# Patient Record
Sex: Male | Born: 1950
Health system: Southern US, Community
[De-identification: ages and names within clinical notes are randomized; demographics above are authoritative.]

## PROBLEM LIST (undated history)

## (undated) DIAGNOSIS — M1990 Unspecified osteoarthritis, unspecified site: Secondary | ICD-10-CM

## (undated) DIAGNOSIS — E7800 Pure hypercholesterolemia, unspecified: Secondary | ICD-10-CM

## (undated) DIAGNOSIS — K429 Umbilical hernia without obstruction or gangrene: Secondary | ICD-10-CM

## (undated) DIAGNOSIS — R7303 Prediabetes: Secondary | ICD-10-CM

## (undated) DIAGNOSIS — K219 Gastro-esophageal reflux disease without esophagitis: Secondary | ICD-10-CM

## (undated) DIAGNOSIS — H8109 Meniere's disease, unspecified ear: Secondary | ICD-10-CM

## (undated) HISTORY — PX: KNEE SURGERY: SHX244

## (undated) HISTORY — PX: KNEE ARTHROSCOPY: SUR90

## (undated) HISTORY — DX: Prediabetes: R73.03

## (undated) HISTORY — PX: CATARACT EXTRACTION: SUR2

## (undated) HISTORY — DX: Gastro-esophageal reflux disease without esophagitis: K21.9

## (undated) HISTORY — DX: Unspecified osteoarthritis, unspecified site: M19.90

---

## 2006-09-12 ENCOUNTER — Encounter: Admission: RE | Admit: 2006-09-12 | Discharge: 2006-09-12 | Payer: Self-pay | Admitting: Family Medicine

## 2009-05-06 ENCOUNTER — Emergency Department (HOSPITAL_COMMUNITY): Admission: EM | Admit: 2009-05-06 | Discharge: 2009-05-06 | Payer: Self-pay | Admitting: Emergency Medicine

## 2011-05-20 ENCOUNTER — Other Ambulatory Visit: Payer: Self-pay | Admitting: Otolaryngology

## 2011-05-20 DIAGNOSIS — H9191 Unspecified hearing loss, right ear: Secondary | ICD-10-CM

## 2011-05-24 ENCOUNTER — Ambulatory Visit
Admission: RE | Admit: 2011-05-24 | Discharge: 2011-05-24 | Disposition: A | Discharge status: Home / Self Care | Payer: 59 | Source: Ambulatory Visit | Attending: Otolaryngology | Admitting: Otolaryngology

## 2011-05-24 DIAGNOSIS — H9191 Unspecified hearing loss, right ear: Secondary | ICD-10-CM

## 2011-05-24 MED ORDER — GADOBENATE DIMEGLUMINE 529 MG/ML IV SOLN
19.0000 mL | Freq: Once | INTRAVENOUS | Status: AC | PRN
  Administered 2011-05-24: 19 mL via INTRAVENOUS

## 2018-01-16 DIAGNOSIS — H8101 Meniere's disease, right ear: Secondary | ICD-10-CM | POA: Diagnosis not present

## 2018-01-16 DIAGNOSIS — H903 Sensorineural hearing loss, bilateral: Secondary | ICD-10-CM | POA: Diagnosis not present

## 2018-05-22 DIAGNOSIS — H524 Presbyopia: Secondary | ICD-10-CM | POA: Diagnosis not present

## 2018-05-22 DIAGNOSIS — H5200 Hypermetropia, unspecified eye: Secondary | ICD-10-CM | POA: Diagnosis not present

## 2018-05-22 DIAGNOSIS — H2513 Age-related nuclear cataract, bilateral: Secondary | ICD-10-CM | POA: Diagnosis not present

## 2018-05-22 DIAGNOSIS — H52229 Regular astigmatism, unspecified eye: Secondary | ICD-10-CM | POA: Diagnosis not present

## 2018-05-22 DIAGNOSIS — H35352 Cystoid macular degeneration, left eye: Secondary | ICD-10-CM | POA: Diagnosis not present

## 2018-05-22 DIAGNOSIS — H5231 Anisometropia: Secondary | ICD-10-CM | POA: Diagnosis not present

## 2018-05-22 DIAGNOSIS — H5210 Myopia, unspecified eye: Secondary | ICD-10-CM | POA: Diagnosis not present

## 2018-05-24 DIAGNOSIS — Z125 Encounter for screening for malignant neoplasm of prostate: Secondary | ICD-10-CM | POA: Diagnosis not present

## 2018-05-24 DIAGNOSIS — K219 Gastro-esophageal reflux disease without esophagitis: Secondary | ICD-10-CM | POA: Diagnosis not present

## 2018-05-24 DIAGNOSIS — Z0000 Encounter for general adult medical examination without abnormal findings: Secondary | ICD-10-CM | POA: Diagnosis not present

## 2018-05-24 DIAGNOSIS — E7800 Pure hypercholesterolemia, unspecified: Secondary | ICD-10-CM | POA: Diagnosis not present

## 2018-05-24 DIAGNOSIS — R030 Elevated blood-pressure reading, without diagnosis of hypertension: Secondary | ICD-10-CM | POA: Diagnosis not present

## 2018-05-24 DIAGNOSIS — H8109 Meniere's disease, unspecified ear: Secondary | ICD-10-CM | POA: Diagnosis not present

## 2018-05-24 DIAGNOSIS — J309 Allergic rhinitis, unspecified: Secondary | ICD-10-CM | POA: Diagnosis not present

## 2018-05-24 DIAGNOSIS — Z23 Encounter for immunization: Secondary | ICD-10-CM | POA: Diagnosis not present

## 2018-05-24 DIAGNOSIS — R7303 Prediabetes: Secondary | ICD-10-CM | POA: Diagnosis not present

## 2018-10-29 DIAGNOSIS — H8101 Meniere's disease, right ear: Secondary | ICD-10-CM | POA: Diagnosis not present

## 2018-10-29 DIAGNOSIS — H903 Sensorineural hearing loss, bilateral: Secondary | ICD-10-CM | POA: Diagnosis not present

## 2020-02-13 ENCOUNTER — Ambulatory Visit: Payer: Self-pay

## 2020-04-06 ENCOUNTER — Other Ambulatory Visit: Payer: Self-pay | Admitting: Surgery

## 2020-05-19 ENCOUNTER — Ambulatory Visit (INDEPENDENT_AMBULATORY_CARE_PROVIDER_SITE_OTHER): Payer: Medicare Other

## 2020-05-19 ENCOUNTER — Other Ambulatory Visit: Payer: Self-pay

## 2020-05-19 ENCOUNTER — Ambulatory Visit (HOSPITAL_COMMUNITY)
Admission: EM | Admit: 2020-05-19 | Discharge: 2020-05-19 | Disposition: A | Discharge status: Home / Self Care | Payer: Medicare Other | Source: Home / Self Care

## 2020-05-19 ENCOUNTER — Encounter (HOSPITAL_COMMUNITY): Payer: Self-pay | Admitting: Orthopedic Surgery

## 2020-05-19 ENCOUNTER — Encounter (HOSPITAL_COMMUNITY): Payer: Self-pay | Admitting: Emergency Medicine

## 2020-05-19 ENCOUNTER — Emergency Department (HOSPITAL_COMMUNITY)
Admission: EM | Admit: 2020-05-19 | Discharge: 2020-05-19 | Disposition: A | Discharge status: Home / Self Care | Payer: Medicare Other | Attending: Emergency Medicine | Admitting: Emergency Medicine

## 2020-05-19 ENCOUNTER — Encounter (HOSPITAL_COMMUNITY): Payer: Self-pay | Admitting: Family Medicine

## 2020-05-19 DIAGNOSIS — W312XXA Contact with powered woodworking and forming machines, initial encounter: Secondary | ICD-10-CM | POA: Insufficient documentation

## 2020-05-19 DIAGNOSIS — Z23 Encounter for immunization: Secondary | ICD-10-CM | POA: Diagnosis not present

## 2020-05-19 DIAGNOSIS — S61214A Laceration without foreign body of right ring finger without damage to nail, initial encounter: Secondary | ICD-10-CM | POA: Diagnosis not present

## 2020-05-19 DIAGNOSIS — Y999 Unspecified external cause status: Secondary | ICD-10-CM | POA: Diagnosis not present

## 2020-05-19 DIAGNOSIS — Y929 Unspecified place or not applicable: Secondary | ICD-10-CM | POA: Insufficient documentation

## 2020-05-19 DIAGNOSIS — S6991XA Unspecified injury of right wrist, hand and finger(s), initial encounter: Secondary | ICD-10-CM

## 2020-05-19 DIAGNOSIS — S61411A Laceration without foreign body of right hand, initial encounter: Secondary | ICD-10-CM

## 2020-05-19 DIAGNOSIS — Y9389 Activity, other specified: Secondary | ICD-10-CM | POA: Diagnosis not present

## 2020-05-19 DIAGNOSIS — S61210A Laceration without foreign body of right index finger without damage to nail, initial encounter: Secondary | ICD-10-CM | POA: Diagnosis not present

## 2020-05-19 DIAGNOSIS — S61011A Laceration without foreign body of right thumb without damage to nail, initial encounter: Secondary | ICD-10-CM | POA: Diagnosis present

## 2020-05-19 MED ORDER — LIDOCAINE HCL (PF) 1 % IJ SOLN
5.0000 mL | Freq: Once | INTRAMUSCULAR | Status: AC
  Administered 2020-05-19: 5 mL via INTRADERMAL
  Filled 2020-05-19: qty 5

## 2020-05-19 MED ORDER — TETANUS-DIPHTH-ACELL PERTUSSIS 5-2.5-18.5 LF-MCG/0.5 IM SUSP
0.5000 mL | Freq: Once | INTRAMUSCULAR | Status: AC
  Administered 2020-05-19: 0.5 mL via INTRAMUSCULAR
  Filled 2020-05-19: qty 0.5

## 2020-05-19 NOTE — ED Triage Notes (Signed)
Pt reports finger injury when working with wood. Reports right thumb is cut deep. Had xray done today that was negative for any fx or foreign bodies.

## 2020-05-19 NOTE — Consult Note (Signed)
Reason for Consult:Right thumb lac Referring Physician: Tillie Moore is an 69 y.o. male.  HPI: Joel Moore was working with a table saw when the piece of wood kicked back on him and struck him in the right hand. He suffered a significant lac and went to UC. They were worried about tendon involvement and sent him to ED where hand surgery was consulted. He is RHD.  History reviewed. No pertinent past medical history.  History reviewed. No pertinent surgical history.  Family History  Problem Relation Age of Onset  . Diabetes Mother     Social History:  reports that he has never smoked. He has never used smokeless tobacco. No history on file for alcohol and drug.  Allergies: Not on File  Medications: I have reviewed the patient's current medications.  No results found for this or any previous visit (from the past 48 hour(s)).  DG Hand Complete Right  Result Date: 05/19/2020 CLINICAL DATA:  Right hand laceration on a table saw today Initial encounter. EXAM: RIGHT HAND - COMPLETE 3+ VIEW COMPARISON:  None. FINDINGS: No acute bony or joint abnormality or radiopaque foreign body is identified. Scattered osteoarthritis is noted. Bandaging is seen about the thumb, index and ring fingers. IMPRESSION: Negative for fracture or radiopaque foreign body. Electronically Signed   By: Joel Moore M.D.   On: 05/19/2020 14:25    Review of Systems  HENT: Negative for ear discharge, ear pain, hearing loss and tinnitus.   Eyes: Negative for photophobia and pain.  Respiratory: Negative for cough and shortness of breath.   Cardiovascular: Negative for chest pain.  Gastrointestinal: Negative for abdominal pain, nausea and vomiting.  Genitourinary: Negative for dysuria, flank pain, frequency and urgency.  Musculoskeletal: Positive for arthralgias (Right thumb). Negative for back pain, myalgias and neck pain.  Neurological: Negative for dizziness and headaches.  Hematological: Does not bruise/bleed  easily.  Psychiatric/Behavioral: The patient is not nervous/anxious.    Blood pressure (!) 153/89, pulse 60, temperature 97.7 F (36.5 C), resp. rate 16, SpO2 99 %. Physical Exam  Constitutional: He appears well-developed and well-nourished. No distress.  HENT:  Head: Normocephalic and atraumatic.  Eyes: Conjunctivae are normal. Right eye exhibits no discharge. Left eye exhibits no discharge. No scleral icterus.  Cardiovascular: Normal rate and regular rhythm.  Respiratory: Effort normal. No respiratory distress.  Musculoskeletal:     Cervical back: Normal range of motion.     Comments: Right shoulder, elbow, wrist, digits- Complex laceration dorsum of thumb over IP joint, tendon exposed but intact, excellent strength with IP flex/ext and MCP flex, ext, add, abd, intact sensation radial/ulnar distally though EDPA reported decreased sensation ulnarly close to base, no instability, no blocks to motion  Sens  Ax/R/M/U intact  Mot   Ax/ R/ PIN/ M/ AIN/ U intact  Rad 2+  Neurological: He is alert.  Skin: Skin is warm and dry. He is not diaphoretic.  Psychiatric: He has a normal mood and affect. His behavior is normal.    Assessment/Plan: Right thumb lac -- Have asked EDPA to I&D and repair skin. He should be splinted with thumb spica to keep thumb in extension and f/u with Dr. Claudia Moore this week.    Joel Abu, PA-C Orthopedic Surgery [phone] 05/19/2020, 4:25 PM

## 2020-05-19 NOTE — ED Provider Notes (Signed)
[facility] EMERGENCY DEPARTMENT Provider Note   CSN: [id number] Arrival date & time: 05/19/20  1450     History Chief Complaint  Patient presents with  . Finger Injury    Joel Moore is a 69 y.o. male.  69 y.o male with no PMH presents to the ED with a chief complaint of right hand laceration x 1 hour ago. Patient reports he was cutting wood with a table saw when his right hand slipped and he cut his right fingers.  There is multiple lacerations noted to his right index finger, right thumb, right ring finger.  Attempted to get evaluated at urgent care, reports he was referred to the ED for possible tendon involvement. He is able to fully range his right hand, but there is slight sensation to the inner thumb region. No other injury or complaint. Last tetanus vaccine unknown but possibly 5 years ago. Not on any blood thinners.   The history is provided by the patient and medical records.       History reviewed. No pertinent past medical history.  There are no problems to display for this patient.   History reviewed. No pertinent surgical history.     Family History  Problem Relation Age of Onset  . Diabetes Mother     Social History   Tobacco Use  . Smoking status: Never Smoker  . Smokeless tobacco: Never Used  Substance Use Topics  . Alcohol use: Not on file  . Drug use: Not on file    Home Medications Prior to Admission medications   Medication Sig Start Date End Date Taking? Authorizing Provider  aspirin 81 MG EC tablet Take by mouth.    [provider]  atorvastatin (LIPITOR) 20 MG tablet  01/26/16   [provider]  chlorthalidone (HYGROTON) 50 MG tablet  01/15/16   [provider]    Allergies    Patient has no allergy information on record.  Review of Systems   Review of Systems  Constitutional: Negative for fever.  Skin: Positive for wound.    Physical Exam Updated Vital Signs BP (!) 153/89   Pulse 60    Temp 97.7 F (36.5 C)   Resp 16   SpO2 99%   Physical Exam Vitals and nursing note reviewed.  Constitutional:      Appearance: Normal appearance.  HENT:     Head: Normocephalic and atraumatic.     Nose: Nose normal.  Eyes:     Pupils: Pupils are equal, round, and reactive to light.  Cardiovascular:     Rate and Rhythm: Normal rate.  Pulmonary:     Effort: Pulmonary effort is normal.     Breath sounds: No wheezing or rales.  Abdominal:     General: Abdomen is flat.  Musculoskeletal:     Right hand: Laceration and tenderness present. No swelling, deformity or bony tenderness. Normal range of motion. Normal strength. Decreased sensation.     Cervical back: Normal range of motion and neck supple.     Comments: Pulses present, capillary refill is intact, full ROM with pain. Sensation slightly decrease on radial distribution. No swelling or obvious deformity noted.   Skin:    General: Skin is warm and dry.  Neurological:     Mental Status: He is alert and oriented to person, place, and time.               ED Results / Procedures / Treatments   Labs (all labs  ordered are listed, but only abnormal results are displayed) Labs Reviewed - No data to display  EKG None  Radiology DG Hand Complete Right  Result Date: 05/19/2020 CLINICAL DATA:  Right hand laceration on a table saw today Initial encounter. EXAM: RIGHT HAND - COMPLETE 3+ VIEW COMPARISON:  None. FINDINGS: No acute bony or joint abnormality or radiopaque foreign body is identified. Scattered osteoarthritis is noted. Bandaging is seen about the thumb, index and ring fingers. IMPRESSION: Negative for fracture or radiopaque foreign body. Electronically Signed   By: Inge Rise M.D.   On: 05/19/2020 14:25    Procedures .Marland KitchenLaceration Repair  Date/Time: 05/19/2020 5:23 PM Performed by: Janeece Fitting, PA-C Authorized by: Janeece Fitting, PA-C   Consent:    Consent obtained:  Verbal   Consent given by:   Patient   Risks discussed:  Infection, pain, tendon damage and nerve damage   Alternatives discussed:  No treatment Anesthesia (see MAR for exact dosages):    Anesthesia method:  None Laceration details:    Location:  Hand   Hand location:  R hand, dorsum   Length (cm):  1   Depth (mm):  1 Repair type:    Repair type:  Simple Exploration:    Hemostasis achieved with:  Direct pressure   Contaminated: no   Treatment:    Area cleansed with:  Saline   Amount of cleaning:  Extensive   Irrigation method:  Pressure wash Skin repair:    Repair method:  Sutures   Suture size:  4-0   Suture material:  Prolene   Suture technique:  Simple interrupted   Number of sutures:  5 Approximation:    Approximation:  Close Post-procedure details:    Dressing:  Bulky dressing and splint for protection   Patient tolerance of procedure:  Tolerated well, no immediate complications   (including critical care time)  Medications Ordered in ED Medications  Tdap (BOOSTRIX) injection 0.5 mL (0.5 mLs Intramuscular Given 05/19/20 1646)  lidocaine (PF) (XYLOCAINE) 1 % injection 5 mL (5 mLs Intradermal Given by Other 05/19/20 1646)    ED Course  I have reviewed the triage vital signs and the nursing notes.  Pertinent labs & imaging results that were available during my care of the patient were reviewed by me and considered in my medical decision making (see chart for details).    MDM Rules/Calculators/A&P   Patient with no pertinent past medical history presents to the ED status post laceration with able saw while cutting some wood earlier today.  Does have a laceration to the right thumb, right index finger, right ring finger.  Was attempted to be evaluated in urgent care however was referred to the ED as it was likely tendon involvement.  Currently not on any blood thinners, his last tetanus vaccine is unknown.  My evaluation the extender tendon is visible in his right thumb, he is able to fully flex and  extend his right thumb, the rest of the lacerations appear to be more so superficial.  These are present, neurovascular intact aside from decreased sensation along the radial distribution on the right thumb.  X-ray obtained at urgent care today which did not show any fracture, acute pathology or foreign body.  Consult placed to orthopedics for further recommendations.  4:22 PM seen and evaluated by Silvestre Gunner, PA orthopedics, who recommended repair along with placement of thumb spica and aloe up with Dr. Claudia Desanctis on an outpatient basis.  Patient's right hand was repaired by me, I  have placed 5 simple interrupted Prolene sutures to the dorsum aspect of his hands, approximation as best as possible.  He is aware he will need to have these removed within 7 to 10 days.  We will have him follow-up with Dr. Claudia Desanctis.  Return precautions discussed at length.    Portions of this note were generated with Lobbyist. Dictation errors may occur despite best attempts at proofreading.  Final Clinical Impression(s) / ED Diagnoses Final diagnoses:  Laceration of right thumb without foreign body without damage to nail, initial encounter    Rx / DC Orders ED Discharge Orders    None       Janeece Fitting, PA-C 05/19/20 1805    Lajean Saver, MD 05/21/20 [phone]

## 2020-05-19 NOTE — Progress Notes (Signed)
Orthopedic Tech Progress Note Patient Details:  Joel Moore 1951-01-11 [id number] Spoke with MD and he said he wanted a removable splint Ortho Devices Type of Ortho Device: Thumb velcro splint Ortho Device/Splint Location: RUE Ortho Device/Splint Interventions: Ordered, Application   Post Interventions Patient Tolerated: Well Instructions Provided: Care of [place] 05/19/2020, 6:07 PM

## 2020-05-19 NOTE — ED Notes (Signed)
Patient verbalizes understanding of discharge instructions . Opportunity for questions and answers were provided . Armband removed by staff ,Pt discharged from ED. W/C  offered at D/C  and Declined W/C at D/C and was escorted to lobby by RN.  

## 2020-05-19 NOTE — ED Notes (Signed)
Patient is being discharged from the [facility] and sent to the Emergency Department via POV. Per Loura Halt, NP patient is stable but in need of higher level of care due to hand laceration/trauma involving tendon/s. Patient is aware and verbalizes understanding of plan of care.  Vitals:   05/19/20 1348  BP: (!) 172/92  Pulse: 89  Temp: 97.7 F (36.5 C)  SpO2: 97%

## 2020-05-19 NOTE — ED Notes (Signed)
Pt. Brought back to triage room.  Dr. Mannie Stabile evaluated hand and feels he is appropriate for Urgent Care Treatment. Patient able to move all fingers and make fist.   Wound cleaned and loosely dressed.  Pt. Will finish registration and we will treat.

## 2020-05-19 NOTE — ED Provider Notes (Signed)
[facility]    CSN: [id number] Arrival date & time: 05/19/20  1320      History   Chief Complaint Chief Complaint  Patient presents with  . Finger Injury    HPI Joel Moore is a 69 y.o. male.   Patient is a 69 year old male who presents today for right hand injury.  This occurred earlier today when he was using a table saw in the wood got injuring his hand.  He has multiple lacerations to hand.  He has good range of motion and sensation is intact.  Bleeding controlled.  Reporting his tetanus is up-to-date.  ROS per HPI      History reviewed. No pertinent past medical history.  There are no problems to display for this patient.   History reviewed. No pertinent surgical history.     Home Medications    Prior to Admission medications   Medication Sig Start Date End Date Taking? Authorizing Provider  aspirin 81 MG EC tablet Take by mouth.   Yes [provider]  atorvastatin (LIPITOR) 20 MG tablet  01/26/16  Yes [provider]  chlorthalidone (HYGROTON) 50 MG tablet  01/15/16  Yes [provider]    Family History Family History  Problem Relation Age of Onset  . Diabetes Mother     Social History Social History   Tobacco Use  . Smoking status: Never Smoker  . Smokeless tobacco: Never Used  Substance Use Topics  . Alcohol use: Not on file  . Drug use: Not on file     Allergies   Patient has no allergy information on record.   Review of Systems Review of Systems   Physical Exam Triage Vital Signs ED Triage Vitals  Enc Vitals Group     BP 05/19/20 1348 (!) 172/92     Pulse Rate 05/19/20 1348 89     Resp --      Temp 05/19/20 1348 97.7 F (36.5 C)     Temp Source 05/19/20 1348 Oral     SpO2 05/19/20 1348 97 %     Weight --      Height --      Head Circumference --      Peak Flow --      Pain Score 05/19/20 1346 6     Pain Loc --      Pain Edu? --      Excl. in [place]? --    No data found.  Updated  Vital Signs BP (!) 172/92 (BP Location: Left Arm)   Pulse 89   Temp 97.7 F (36.5 C) (Oral)   SpO2 97%   Visual Acuity Right Eye Distance:   Left Eye Distance:   Bilateral Distance:    Right Eye Near:   Left Eye Near:    Bilateral Near:     Physical Exam Vitals and nursing note reviewed.  Constitutional:      Appearance: Normal appearance.  HENT:     Head: Normocephalic and atraumatic.     Nose: Nose normal.  Eyes:     Conjunctiva/sclera: Conjunctivae normal.  Pulmonary:     Effort: Pulmonary effort is normal.  Musculoskeletal:        General: Normal range of motion.     Cervical back: Normal range of motion.     Comments: See photos for detail Good ROM.   Skin:    General: Skin is warm and dry.  Neurological:     Mental Status: He is alert.  Psychiatric:        Mood and Affect: Mood normal.            UC Treatments / Results  Labs (all labs ordered are listed, but only abnormal results are displayed) Labs Reviewed - No data to display  EKG   Radiology DG Hand Complete Right  Result Date: 05/19/2020 CLINICAL DATA:  Right hand laceration on a table saw today Initial encounter. EXAM: RIGHT HAND - COMPLETE 3+ VIEW COMPARISON:  None. FINDINGS: No acute bony or joint abnormality or radiopaque foreign body is identified. Scattered osteoarthritis is noted. Bandaging is seen about the thumb, index and ring fingers. IMPRESSION: Negative for fracture or radiopaque foreign body. Electronically Signed   By: Inge Rise M.D.   On: 05/19/2020 14:25    Procedures Procedures (including critical care time)  Medications Ordered in UC Medications - No data to display  Initial Impression / Assessment and Plan / UC Course  I have reviewed the triage vital signs and the nursing notes.  Pertinent labs & imaging results that were available during my care of the patient were reviewed by me and considered in my medical decision making (see chart for details).      Hand injury with multiple lacerations and possible tendon involvement. He does have good ROM.  X-ray negative for any acute fracture or foreign body.  Sending to the ER for further management and consult with hand specialist based on deep thumb wound.  Final Clinical Impressions(s) / UC Diagnoses   Final diagnoses:  Hand injury, right, initial encounter     Discharge Instructions     Please go to the ER for further evaluation.     ED Prescriptions    None     PDMP not reviewed this encounter.   Loura Halt A, NP 05/20/20 [phone]

## 2020-05-19 NOTE — Discharge Instructions (Signed)
Please go to the ER for further evaluation °

## 2020-05-19 NOTE — Discharge Instructions (Addendum)
I have placed 5 sutures to your right hand, you will need to have these removed within 7-10 days.   Please keep the splint clean and dry.  Once splint is removed you may apply bacitracin or Sporn to the wound.

## 2020-05-25 ENCOUNTER — Ambulatory Visit: Payer: Medicare Other | Admitting: Plastic Surgery

## 2020-05-25 ENCOUNTER — Encounter: Payer: Self-pay | Admitting: Plastic Surgery

## 2020-05-25 ENCOUNTER — Other Ambulatory Visit: Payer: Self-pay

## 2020-05-25 VITALS — BP 117/65 | HR 49 | Temp 97.8°F | Ht 67.0 in | Wt 213.6 lb

## 2020-05-25 DIAGNOSIS — S61411A Laceration without foreign body of right hand, initial encounter: Secondary | ICD-10-CM | POA: Diagnosis not present

## 2020-05-25 NOTE — Progress Notes (Signed)
   Referring Provider Kelton Pillar, MD [place] [employer] [place],  [place] [address]   CC:  Chief Complaint  Patient presents with  . Consult    hand laceration of the right thumb without FB without damage to nail      Joel Moore is an 69 y.o. male.   HPI: Patient presents about a week out from injury to his right hand.  He is using saw to split some wood and the wood kicked back causing a laceration to the dorsal aspect of his thumb and the tip of his ring finger.  He got suture stitches in the emergency room and there was initially a concern for exposed tendon of the thumb but this was found to be completely intact and he had normal functional extension of the thumb.  Since then he says everything is going fine and he has more pain in the ring finger than in the thumb.  No Known Allergies  Outpatient Encounter Medications as of 05/25/2020  Medication Sig  . aspirin 81 MG EC tablet Take by mouth.  Marland Kitchen atorvastatin (LIPITOR) 20 MG tablet   . chlorthalidone (HYGROTON) 50 MG tablet    No facility-administered encounter medications on file as of 05/25/2020.     No past medical history on file.  No past surgical history on file.  Family History  Problem Relation Age of Onset  . Diabetes Mother     Social History   Social History Narrative  . Not on file     Review of Systems General: Denies fevers, chills, weight loss CV: Denies chest pain, shortness of breath, palpitations  Physical Exam Vitals with BMI 05/25/2020 05/19/2020 05/19/2020  Height 5\' 7"  - -  Weight 213 lbs 10 oz - -  BMI 93.26 - -  Systolic [phone]  Diastolic 65 81 89  Pulse 49 65 60    General:  No acute distress,  Alert and oriented, Non-Toxic, Normal speech and affect Right hand: Fingers are well-perfused with normal capillary refill and a palpable radial pulse.  Sensation is intact throughout.  He has a dorsal laceration proximal to the IP joint of the thumb.  There is a small amount of  devitalized skin but overall the wound looks to be healing fine.  Sutures are in place.  He has good flexion extension at the IP joint.  Ring finger has another traumatic laceration at the tip with some small areas of necrosis along the laceration.  Sutures were placed here as well.  I do not detect any signs of infection in either place.  Assessment/Plan Patient presents with a soft tissue injury to the hand after a saw accident.  I think the skin will go on to heal fine.  We have given him wound care instructions and will plan to come back next week for another exam and removal of the remainder of his sutures.  All of his questions were answered.  He knows if there is any worsening of the erythema or swelling that he can call us and we can see him sooner.  Cindra Presume 05/25/2020, 4:33 PM

## 2020-06-03 ENCOUNTER — Ambulatory Visit: Payer: Medicare Other | Admitting: Plastic Surgery

## 2020-06-03 ENCOUNTER — Encounter: Payer: Self-pay | Admitting: Plastic Surgery

## 2020-06-03 ENCOUNTER — Other Ambulatory Visit: Payer: Self-pay

## 2020-06-03 VITALS — BP 134/73 | HR 53 | Temp 98.0°F

## 2020-06-03 DIAGNOSIS — S61011D Laceration without foreign body of right thumb without damage to nail, subsequent encounter: Secondary | ICD-10-CM

## 2020-06-03 NOTE — Progress Notes (Signed)
Patient is a 69 year old male who presented to the emergency department on 05/19/2020 with laceration to the dorsal aspect of his thumb and the tip of his ring finger.  He received suture stitches in the emergency room.  Thumb tendon was found to be intact with normal function.  ~ 2 weeks  Right thumb wound has good granulation tissue formation. Exudate present. Sutures are in place.  Sutures removed. No signs of infection. No redness of surrounding tissue. Able to fully extend thumb, able to partially flex thumb due to stiffness. Denies pain, F, N/V. Tip of ing finger has some scabbing and granulation tissue visible.  No signs of infection.  Do daily dressing changes to thumb consisting of vaseline and bandage. May apply vaseline to tip of ring finger as desired.   Follow up in 3 weeks for wound and ROM check.  May consider physical therapy if stiffness of thumb remains. Call office with any questions/concerns.

## 2020-06-08 ENCOUNTER — Encounter (HOSPITAL_BASED_OUTPATIENT_CLINIC_OR_DEPARTMENT_OTHER): Payer: Self-pay | Admitting: Surgery

## 2020-06-09 ENCOUNTER — Other Ambulatory Visit: Payer: Self-pay

## 2020-06-09 ENCOUNTER — Encounter (HOSPITAL_BASED_OUTPATIENT_CLINIC_OR_DEPARTMENT_OTHER): Payer: Self-pay | Admitting: Surgery

## 2020-06-12 ENCOUNTER — Other Ambulatory Visit (HOSPITAL_COMMUNITY)
Admission: RE | Admit: 2020-06-12 | Discharge: 2020-06-12 | Disposition: A | Discharge status: Home / Self Care | Payer: Medicare Other | Source: Ambulatory Visit | Attending: Surgery | Admitting: Surgery

## 2020-06-12 ENCOUNTER — Encounter (HOSPITAL_BASED_OUTPATIENT_CLINIC_OR_DEPARTMENT_OTHER)
Admission: RE | Admit: 2020-06-12 | Discharge: 2020-06-12 | Disposition: A | Discharge status: Home / Self Care | Payer: Medicare Other | Source: Ambulatory Visit | Attending: Surgery | Admitting: Surgery

## 2020-06-12 DIAGNOSIS — Z20822 Contact with and (suspected) exposure to covid-19: Secondary | ICD-10-CM | POA: Diagnosis not present

## 2020-06-12 DIAGNOSIS — Z01812 Encounter for preprocedural laboratory examination: Secondary | ICD-10-CM | POA: Insufficient documentation

## 2020-06-12 DIAGNOSIS — Z7982 Long term (current) use of aspirin: Secondary | ICD-10-CM | POA: Diagnosis not present

## 2020-06-12 DIAGNOSIS — Z79899 Other long term (current) drug therapy: Secondary | ICD-10-CM | POA: Diagnosis not present

## 2020-06-12 DIAGNOSIS — K429 Umbilical hernia without obstruction or gangrene: Secondary | ICD-10-CM | POA: Diagnosis present

## 2020-06-12 DIAGNOSIS — Z87891 Personal history of nicotine dependence: Secondary | ICD-10-CM | POA: Diagnosis not present

## 2020-06-12 LAB — BASIC METABOLIC PANEL
Anion gap: 12 (ref 5–15)
BUN: 19 mg/dL (ref 8–23)
CO2: 27 mmol/L (ref 22–32)
Calcium: 9.2 mg/dL (ref 8.9–10.3)
Chloride: 100 mmol/L (ref 98–111)
Creatinine, Ser: 1.07 mg/dL (ref 0.61–1.24)
GFR calc Af Amer: >60 mL/min (ref >60)
GFR calc non Af Amer: >60 mL/min (ref >60)
Glucose, Bld: 107 mg/dL — ABNORMAL HIGH (ref 70–99)
Potassium: 4.3 mmol/L (ref 3.5–5.1)
Sodium: 139 mmol/L (ref 135–145)

## 2020-06-12 LAB — SARS CORONAVIRUS 2 (TAT 6-24 HRS): SARS Coronavirus 2: NEGATIVE

## 2020-06-12 NOTE — Progress Notes (Signed)

## 2020-06-15 NOTE — H&P (Signed)
Joel Moore  Location: [facility] Patient #: [id number] DOB: [birth year] Married / Language: English / Race: White Male   History of Present Illness The patient is a 69 year old male who presents with an umbilical hernia.  Chief complaint: Umbilical hernia  History: This is a pleasant 69 year old gentleman referred by Dr. Kelton Pillar for evaluation of an enlarging umbilical hernia. He has had a hernia for several years but it is now getting larger and he is having increased discomfort at the umbilicus. He will have an occasional mild sharp pain. He has no obstructive symptoms. He is still very active. He has had no predisposing general anesthesia. He is otherwise without complaints. His pain does not refer anywhere else.   Past Surgical History (Chanel Teressa Senter, CMA; Colon Polyp Removal - Colonoscopy  Knee Surgery  Left. Oral Surgery   Diagnostic Studies History (Chanel Teressa Senter, CMA; Colonoscopy  within last year  Allergies (Chanel Teressa Senter, CMA;  No Known Allergies  [04/06/2020]: No Known Drug Allergies  [04/06/2020]: Allergies Reconciled   Medication History (Chanel Nolan, CMA;  Aspirin (81MG  Tablet, Oral) Active. Glucosamine Chondroitin (Oral) Specific strength unknown - Active. Lipitor (Oral) Specific strength unknown - Active. Hydralazine-HCTZ (Oral) Specific strength unknown - Active. Medications Reconciled  Social History Leisure centre manager, CMA;  Alcohol use  Occasional alcohol use. Caffeine use  Carbonated beverages. No drug use  Tobacco use  Former smoker.  Family History ([place], [place];  Family history unknown  First Degree Relatives   Other Problems (Chanel Teressa Senter, [place]; Arthritis  Back Pain  Gastroesophageal Reflux Disease  Hypercholesterolemia     Review of Systems (Chanel [place] CMA; General Not Present- Appetite Loss, Chills, Fatigue, Fever, Night Sweats, Weight Gain and Weight Loss. Skin Not Present- Change in  Wart/Mole, Dryness, Hives, Jaundice, New Lesions, Non-Healing Wounds, Rash and Ulcer. HEENT Present- Hearing Loss and Ringing in the Ears. Not Present- Earache, Hoarseness, Nose Bleed, Oral Ulcers, Seasonal Allergies, Sinus Pain, Sore Throat, Visual Disturbances, Wears glasses/contact lenses and Yellow Eyes. Respiratory Present- Snoring. Not Present- Bloody sputum, Chronic Cough, Difficulty Breathing and Wheezing. Breast Not Present- Breast Mass, Breast Pain, Nipple Discharge and Skin Changes. Cardiovascular Not Present- Chest Pain, Difficulty Breathing Lying Down, Leg Cramps, Palpitations, Rapid Heart Rate, Shortness of Breath and Swelling of Extremities. Gastrointestinal Present- Constipation. Not Present- Abdominal Pain, Bloating, Bloody Stool, Change in Bowel Habits, Chronic diarrhea, Difficulty Swallowing, Excessive gas, Gets full quickly at meals, Hemorrhoids, Indigestion, Nausea, Rectal Pain and Vomiting. Male Genitourinary Not Present- Blood in Urine, Change in Urinary Stream, Frequency, Impotence, Nocturia, Painful Urination, Urgency and Urine Leakage.  Vitals   Weight: 218.38 lb Height: 67in Body Surface Area: 2.1 m Body Mass Index: 34.2 kg/m  Temp.: 97.62F  Pulse: 64 (Regular)  BP: 126/78(Sitting, Left Arm, Standard)       Physical Exam The physical exam findings are as follows: Note: He appears well and exam  His abdomen is soft and nontender. There is a moderate sized umbilical hernia containing a moderate amount of omentum which I was able to reduce.  There is no hepatosplenomegaly  Skin is normal  There were no inguinal hernias    Assessment & Plan   UMBILICAL HERNIA (X38.1)  Impression: I have reviewed the notes from his primary care provider and notes in the electronic medical records. I discussed the diagnosis of umbilical hernia with him. We discussed the reasons for repair of hernias. As this is getting quite larger and causing symptoms, and  open umbilical  hernia repair with mesh was recommended. I discussed the surgical procedure in detail. I discussed these of mesh. I discussed the risks which includes but is not limited to bleeding, infection, hernia recurrence, use of mesh, injury to surrounding structures, cardiopulmonary issues, postoperative recovery, etc. He understands and wished to proceed with surgery.

## 2020-06-16 ENCOUNTER — Ambulatory Visit (HOSPITAL_BASED_OUTPATIENT_CLINIC_OR_DEPARTMENT_OTHER): Payer: Medicare Other | Admitting: Certified Registered"

## 2020-06-16 ENCOUNTER — Encounter (HOSPITAL_BASED_OUTPATIENT_CLINIC_OR_DEPARTMENT_OTHER): Payer: Self-pay | Admitting: Surgery

## 2020-06-16 ENCOUNTER — Ambulatory Visit (HOSPITAL_BASED_OUTPATIENT_CLINIC_OR_DEPARTMENT_OTHER)
Admission: RE | Admit: 2020-06-16 | Discharge: 2020-06-16 | Disposition: A | Discharge status: Home / Self Care | Payer: Medicare Other | Attending: Surgery | Admitting: Surgery

## 2020-06-16 ENCOUNTER — Other Ambulatory Visit: Payer: Self-pay

## 2020-06-16 ENCOUNTER — Encounter (HOSPITAL_BASED_OUTPATIENT_CLINIC_OR_DEPARTMENT_OTHER)
Admission: RE | Disposition: A | Discharge status: Home / Self Care | Payer: Self-pay | Source: Home / Self Care | Attending: Surgery

## 2020-06-16 DIAGNOSIS — Z87891 Personal history of nicotine dependence: Secondary | ICD-10-CM | POA: Diagnosis not present

## 2020-06-16 DIAGNOSIS — K429 Umbilical hernia without obstruction or gangrene: Secondary | ICD-10-CM | POA: Diagnosis not present

## 2020-06-16 DIAGNOSIS — Z79899 Other long term (current) drug therapy: Secondary | ICD-10-CM | POA: Insufficient documentation

## 2020-06-16 DIAGNOSIS — Z7982 Long term (current) use of aspirin: Secondary | ICD-10-CM | POA: Diagnosis not present

## 2020-06-16 HISTORY — DX: Pure hypercholesterolemia, unspecified: E78.00

## 2020-06-16 HISTORY — DX: Umbilical hernia without obstruction or gangrene: K42.9

## 2020-06-16 HISTORY — PX: UMBILICAL HERNIA REPAIR: SHX196

## 2020-06-16 HISTORY — PX: INSERTION OF MESH: SHX5868

## 2020-06-16 HISTORY — DX: Meniere's disease, unspecified ear: H81.09

## 2020-06-16 SURGERY — REPAIR, HERNIA, UMBILICAL, ADULT
Anesthesia: General | Site: Abdomen

## 2020-06-16 MED ORDER — HYDROMORPHONE HCL 1 MG/ML IJ SOLN
0.2500 mg | INTRAMUSCULAR | Status: DC | PRN
  Administered 2020-06-16: 0.5 mg via INTRAVENOUS

## 2020-06-16 MED ORDER — PROMETHAZINE HCL 25 MG/ML IJ SOLN: 6.2500 mg | INTRAMUSCULAR | Status: DC | PRN

## 2020-06-16 MED ORDER — CEFAZOLIN SODIUM-DEXTROSE 2-4 GM/100ML-% IV SOLN
INTRAVENOUS | Status: AC
  Filled 2020-06-16: qty 100

## 2020-06-16 MED ORDER — AMISULPRIDE (ANTIEMETIC) 5 MG/2ML IV SOLN: 10.0000 mg | Freq: Once | INTRAVENOUS | Status: DC | PRN

## 2020-06-16 MED ORDER — OXYCODONE HCL 5 MG PO TABS: 5.0000 mg | ORAL_TABLET | Freq: Once | ORAL | Status: DC | PRN

## 2020-06-16 MED ORDER — ROCURONIUM BROMIDE 100 MG/10ML IV SOLN
INTRAVENOUS | Status: DC | PRN
  Administered 2020-06-16: 100 mg via INTRAVENOUS

## 2020-06-16 MED ORDER — ACETAMINOPHEN 500 MG PO TABS
ORAL_TABLET | ORAL | Status: AC
  Filled 2020-06-16: qty 2

## 2020-06-16 MED ORDER — MEPERIDINE HCL 25 MG/ML IJ SOLN: 6.2500 mg | INTRAMUSCULAR | Status: DC | PRN

## 2020-06-16 MED ORDER — BUPIVACAINE HCL (PF) 0.5 % IJ SOLN
INTRAMUSCULAR | Status: AC
  Filled 2020-06-16: qty 210

## 2020-06-16 MED ORDER — ONDANSETRON HCL 4 MG/2ML IJ SOLN
INTRAMUSCULAR | Status: DC | PRN
  Administered 2020-06-16: 4 mg via INTRAVENOUS

## 2020-06-16 MED ORDER — HYDROMORPHONE HCL 1 MG/ML IJ SOLN
INTRAMUSCULAR | Status: AC
  Filled 2020-06-16: qty 0.5

## 2020-06-16 MED ORDER — PROPOFOL 10 MG/ML IV BOLUS
INTRAVENOUS | Status: DC | PRN
  Administered 2020-06-16: 200 mg via INTRAVENOUS

## 2020-06-16 MED ORDER — LIDOCAINE HCL (PF) 1 % IJ SOLN
INTRAMUSCULAR | Status: AC
  Filled 2020-06-16: qty 30

## 2020-06-16 MED ORDER — BUPIVACAINE HCL (PF) 0.5 % IJ SOLN
INTRAMUSCULAR | Status: DC | PRN
  Administered 2020-06-16: 20 mL

## 2020-06-16 MED ORDER — SUGAMMADEX SODIUM 500 MG/5ML IV SOLN
INTRAVENOUS | Status: DC | PRN
Start: 2020-06-16 — End: 2020-06-16
  Administered 2020-06-16: 400 mg via INTRAVENOUS

## 2020-06-16 MED ORDER — GABAPENTIN 300 MG PO CAPS
ORAL_CAPSULE | ORAL | Status: AC
  Filled 2020-06-16: qty 1

## 2020-06-16 MED ORDER — DEXAMETHASONE SODIUM PHOSPHATE 4 MG/ML IJ SOLN
INTRAMUSCULAR | Status: DC | PRN
  Administered 2020-06-16: 8 mg via INTRAVENOUS

## 2020-06-16 MED ORDER — MIDAZOLAM HCL 5 MG/5ML IJ SOLN
INTRAMUSCULAR | Status: DC | PRN
  Administered 2020-06-16: 2 mg via INTRAVENOUS

## 2020-06-16 MED ORDER — OXYCODONE HCL 5 MG PO TABS: 5.0000 mg | ORAL_TABLET | Freq: Four times a day (QID) | ORAL | 0 refills | Status: AC | PRN

## 2020-06-16 MED ORDER — CEFAZOLIN SODIUM-DEXTROSE 2-4 GM/100ML-% IV SOLN
2.0000 g | INTRAVENOUS | Status: AC
  Administered 2020-06-16: 2 g via INTRAVENOUS

## 2020-06-16 MED ORDER — ACETAMINOPHEN 500 MG PO TABS
1000.0000 mg | ORAL_TABLET | ORAL | Status: AC
  Administered 2020-06-16: 1000 mg via ORAL

## 2020-06-16 MED ORDER — MIDAZOLAM HCL 2 MG/2ML IJ SOLN
INTRAMUSCULAR | Status: AC
  Filled 2020-06-16: qty 2

## 2020-06-16 MED ORDER — LIDOCAINE-EPINEPHRINE (PF) 1 %-1:200000 IJ SOLN
INTRAMUSCULAR | Status: AC
  Filled 2020-06-16: qty 30

## 2020-06-16 MED ORDER — OXYCODONE HCL 5 MG/5ML PO SOLN: 5.0000 mg | Freq: Once | ORAL | Status: DC | PRN

## 2020-06-16 MED ORDER — FENTANYL CITRATE (PF) 100 MCG/2ML IJ SOLN
INTRAMUSCULAR | Status: AC
  Filled 2020-06-16: qty 2

## 2020-06-16 MED ORDER — GABAPENTIN 300 MG PO CAPS
300.0000 mg | ORAL_CAPSULE | ORAL | Status: AC
  Administered 2020-06-16: 300 mg via ORAL

## 2020-06-16 MED ORDER — DEXAMETHASONE SODIUM PHOSPHATE 10 MG/ML IJ SOLN
INTRAMUSCULAR | Status: AC
  Filled 2020-06-16: qty 1

## 2020-06-16 MED ORDER — CHLORHEXIDINE GLUCONATE CLOTH 2 % EX PADS: 6.0000 | MEDICATED_PAD | Freq: Once | CUTANEOUS | Status: DC

## 2020-06-16 MED ORDER — ONDANSETRON HCL 4 MG/2ML IJ SOLN
INTRAMUSCULAR | Status: AC
  Filled 2020-06-16: qty 2

## 2020-06-16 MED ORDER — LIDOCAINE HCL (CARDIAC) PF 100 MG/5ML IV SOSY
PREFILLED_SYRINGE | INTRAVENOUS | Status: DC | PRN
  Administered 2020-06-16: 100 mg via INTRAVENOUS

## 2020-06-16 MED ORDER — LACTATED RINGERS IV SOLN: INTRAVENOUS | Status: DC

## 2020-06-16 MED ORDER — PROPOFOL 10 MG/ML IV BOLUS
INTRAVENOUS | Status: AC
  Filled 2020-06-16: qty 40

## 2020-06-16 MED ORDER — LIDOCAINE 2% (20 MG/ML) 5 ML SYRINGE
INTRAMUSCULAR | Status: AC
  Filled 2020-06-16: qty 5

## 2020-06-16 MED ORDER — FENTANYL CITRATE (PF) 100 MCG/2ML IJ SOLN
INTRAMUSCULAR | Status: DC | PRN
  Administered 2020-06-16: 100 ug via INTRAVENOUS

## 2020-06-16 SURGICAL SUPPLY — 43 items
ADH SKN CLS APL DERMABOND .7 (GAUZE/BANDAGES/DRESSINGS) ×1
APL PRP STRL LF DISP 70% ISPRP (MISCELLANEOUS) ×1
BLADE CLIPPER SURG (BLADE) IMPLANT
BLADE SURG 15 STRL LF DISP TIS (BLADE) ×1 IMPLANT
BLADE SURG 15 STRL SS (BLADE) ×2
CANISTER SUCT 1200ML W/VALVE (MISCELLANEOUS) IMPLANT
CHLORAPREP W/TINT 26 (MISCELLANEOUS) ×2 IMPLANT
COVER BACK TABLE 60X90IN (DRAPES) ×2 IMPLANT
COVER MAYO STAND STRL (DRAPES) ×2 IMPLANT
COVER WAND RF STERILE (DRAPES) IMPLANT
DECANTER SPIKE VIAL GLASS SM (MISCELLANEOUS) IMPLANT
DERMABOND ADVANCED (GAUZE/BANDAGES/DRESSINGS) ×1
DERMABOND ADVANCED .7 DNX12 (GAUZE/BANDAGES/DRESSINGS) ×2 IMPLANT
DRAPE LAPAROTOMY 100X72 PEDS (DRAPES) ×2 IMPLANT
DRAPE UTILITY XL STRL (DRAPES) ×2 IMPLANT
DRSG TEGADERM 2-3/8X2-3/4 SM (GAUZE/BANDAGES/DRESSINGS) IMPLANT
ELECT REM PT RETURN 9FT ADLT (ELECTROSURGICAL) ×2
ELECTRODE REM PT RTRN 9FT ADLT (ELECTROSURGICAL) ×1 IMPLANT
GLOVE SURG SIGNA 7.5 PF LTX (GLOVE) ×2 IMPLANT
GOWN STRL REUS W/ TWL LRG LVL3 (GOWN DISPOSABLE) ×1 IMPLANT
GOWN STRL REUS W/ TWL XL LVL3 (GOWN DISPOSABLE) ×1 IMPLANT
GOWN STRL REUS W/TWL LRG LVL3 (GOWN DISPOSABLE) ×2
GOWN STRL REUS W/TWL XL LVL3 (GOWN DISPOSABLE) ×2
MESH VENTRALEX ST 1-7/10 CRC S (Mesh General) ×1 IMPLANT
NDL HYPO 25X1 1.5 SAFETY (NEEDLE) ×1 IMPLANT
NEEDLE HYPO 25X1 1.5 SAFETY (NEEDLE) ×2 IMPLANT
NS IRRIG 1000ML POUR BTL (IV SOLUTION) IMPLANT
PENCIL SMOKE EVACUATOR (MISCELLANEOUS) ×2 IMPLANT
SET BASIN DAY SURGERY F.S. (CUSTOM PROCEDURE TRAY) ×2 IMPLANT
SLEEVE SCD COMPRESS KNEE MED (MISCELLANEOUS) ×2 IMPLANT
SPONGE LAP 4X18 RFD (DISPOSABLE) ×1 IMPLANT
SUT MNCRL AB 4-0 PS2 18 (SUTURE) ×2 IMPLANT
SUT NOVA 0 T19/GS 22DT (SUTURE) IMPLANT
SUT NOVA NAB DX-16 0-1 5-0 T12 (SUTURE) ×1 IMPLANT
SUT NOVA NAB GS-21 1 T12 (SUTURE) IMPLANT
SUT VIC AB 2-0 SH 27 (SUTURE)
SUT VIC AB 2-0 SH 27XBRD (SUTURE) IMPLANT
SUT VIC AB 3-0 SH 27 (SUTURE) ×2
SUT VIC AB 3-0 SH 27X BRD (SUTURE) ×1 IMPLANT
SYR CONTROL 10ML LL (SYRINGE) ×2 IMPLANT
TOWEL GREEN STERILE FF (TOWEL DISPOSABLE) ×2 IMPLANT
TUBE CONNECTING 20X1/4 (TUBING) IMPLANT
YANKAUER SUCT BULB TIP NO VENT (SUCTIONS) IMPLANT

## 2020-06-16 NOTE — Anesthesia Postprocedure Evaluation (Signed)
Anesthesia Post Note  Patient: [place]  Procedure(s) Performed: UMBILICAL HERNIA REPAIR WITH MESH (N/A Abdomen) INSERTION OF MESH (N/A Abdomen)     Patient location during evaluation: PACU Anesthesia Type: General Level of consciousness: awake and alert Pain management: pain level controlled Vital Signs Assessment: post-procedure vital signs reviewed and stable Respiratory status: spontaneous breathing, nonlabored ventilation and respiratory function stable Cardiovascular status: blood pressure returned to baseline and stable Postop Assessment: no apparent nausea or vomiting Anesthetic complications: no   No complications documented.  Last Vitals:  Vitals:   06/16/20 0843 06/16/20 0854  BP:  119/77  Pulse: (!) 56 (!) 53  Resp: 13 18  Temp:  36.7 C  SpO2: 95% 97%    Last Pain:  Vitals:   06/16/20 0854  TempSrc: Oral  PainSc: 4                  Lynda Rainwater

## 2020-06-16 NOTE — Op Note (Signed)
UMBILICAL HERNIA REPAIR WITH MESH, INSERTION OF MESH  Procedure Note  Joel Moore 06/16/2020   Pre-op Diagnosis: UMBILICAL HERNIA     Post-op Diagnosis: same  Procedure(s): UMBILICAL HERNIA REPAIR WITH MESH INSERTION OF MESH  Surgeon(s): Coralie Keens, MD  Anesthesia: General  Staff:  Circulator: McDonough-Hughes, Delene Ruffini, RN Scrub Person: Jackie Plum Circulator Assistant: Izora Ribas, RN  Estimated Blood Loss: Minimal               Procedure: The patient was brought to the operating room and identifies correct patient.  He was placed upon on the operating room table and general anesthesia was induced.  His abdomen was then prepped and draped in the usual sterile fashion.  I anesthetized the skin of the lower edge of the umbilicus with Marcaine.  I then made a transverse incision with a scalpel.  I carried this down to the hernia sac which was then separated from the overlying umbilical skin and opened.  It contained omentum.  The actual fascial defect was slightly less than a centimeter in size.  I excised some of the omentum with the cautery and reduced the rest into the abdominal cavity.  I excised the redundant sac with the cautery.  I then brought a 4.3 cm round ventral Prolene patch from [place] onto the field.  It was placed through the fascial opening and then pulled up against the peritoneum with the ties.  I sutured the mesh in place circumferentially with interrupted #1 Novafil sutures.  I then cut the ties and closed the fascia over the top of the mesh with a figure-of-eight #1 Novafil suture.  Wide coverage of the defect appeared to be achieved.  I anesthetized the fascia further with Marcaine.  Hemostasis appeared to be achieved.  I then closed the subcutaneous tissue with interrupted 3-0 Vicryl sutures and closed skin with a running 4-0 Monocryl.  Dermabond was then applied.  The patient tolerated the procedure well.  All the counts were correct at the end of the  procedure.  The patient was then extubated in the operating room and taken in a stable condition to the recovery room.          Coralie Keens   Date: 06/16/2020  Time: 7:47 AM

## 2020-06-16 NOTE — Transfer of Care (Signed)
Immediate Anesthesia Transfer of Care Note  Patient: Joel Moore  Procedure(s) Performed: UMBILICAL HERNIA REPAIR WITH MESH (N/A Abdomen) INSERTION OF MESH (N/A Abdomen)  Patient Location: PACU  Anesthesia Type:General  Level of Consciousness: awake, alert  and oriented  Airway & Oxygen Therapy: Patient Spontanous Breathing and Patient connected to face mask oxygen  Post-op Assessment: Report given to RN and Post -op Vital signs reviewed and stable  Post vital signs: Reviewed and stable  Last Vitals:  Vitals Value Taken Time  BP 129/89 06/16/20 0752  Temp    Pulse 69 06/16/20 0753  Resp 16 06/16/20 0753  SpO2 98 % 06/16/20 0753  Vitals shown include unvalidated device data.  Last Pain:  Vitals:   06/16/20 0633  TempSrc: Oral  PainSc: 0-No pain      Patients Stated Pain Goal: 6 (40/97/35 3299)  Complications: No complications documented.

## 2020-06-16 NOTE — Anesthesia Preprocedure Evaluation (Signed)
Anesthesia Evaluation  Patient identified by MRN, date of birth, ID band Patient awake    Reviewed: Allergy & Precautions, NPO status , Patient's Chart, lab work & pertinent test results  Airway Mallampati: II  TM Distance: >3 FB Neck ROM: Full    Dental no notable dental hx.    Pulmonary neg pulmonary ROS,    Pulmonary exam normal breath sounds clear to auscultation       Cardiovascular negative cardio ROS Normal cardiovascular exam Rhythm:Regular Rate:Normal     Neuro/Psych negative neurological ROS  negative psych ROS   GI/Hepatic negative GI ROS, Neg liver ROS,   Endo/Other  negative endocrine ROS  Renal/GU negative Renal ROS  negative genitourinary   Musculoskeletal negative musculoskeletal ROS (+)   Abdominal (+) + obese,   Peds negative pediatric ROS (+)  Hematology negative hematology ROS (+)   Anesthesia Other Findings   Reproductive/Obstetrics negative OB ROS                             Anesthesia Physical Anesthesia Plan  ASA: II  Anesthesia Plan: General   Post-op Pain Management:    Induction: Intravenous  PONV Risk Score and Plan: 2 and Ondansetron, Midazolam and Treatment may vary due to age or medical condition  Airway Management Planned: Oral ETT  Additional Equipment:   Intra-op Plan:   Post-operative Plan: Extubation in OR  Informed Consent: I have reviewed the patients History and Physical, chart, labs and discussed the procedure including the risks, benefits and alternatives for the proposed anesthesia with the patient or authorized representative who has indicated his/her understanding and acceptance.     Dental advisory given  Plan Discussed with: CRNA  Anesthesia Plan Comments:         Anesthesia Quick Evaluation

## 2020-06-16 NOTE — Discharge Instructions (Signed)
[facility] _______[facility]  UMBILICAL OR INGUINAL HERNIA REPAIR: POST OP INSTRUCTIONS  Always review your discharge instruction sheet given to you by the facility where your surgery was performed. IF YOU HAVE DISABILITY OR FAMILY LEAVE FORMS, YOU MUST BRING THEM TO THE OFFICE FOR PROCESSING.   DO NOT GIVE THEM TO YOUR DOCTOR.  1. A  prescription for pain medication may be given to you upon discharge.  Take your pain medication as prescribed, if needed.  If narcotic pain medicine is not needed, then you may take acetaminophen (Tylenol) or ibuprofen (Advil) as needed. 2. Take your usually prescribed medications unless otherwise directed. If you need a refill on your pain medication, please contact your pharmacy.  They will contact our office to request authorization. Prescriptions will not be filled after 5 pm or on week-ends. 3. You should follow a light diet the first 24 hours after arrival home, such as soup and crackers, etc.  Be sure to include lots of fluids daily.  Resume your normal diet the day after surgery. 4.Most patients will experience some swelling and bruising around the umbilicus or in the groin and scrotum.  Ice packs and reclining will help.  Swelling and bruising can take several days to resolve.  6. It is common to experience some constipation if taking pain medication after surgery.  Increasing fluid intake and taking a stool softener (such as Colace) will usually help or prevent this problem from occurring.  A mild laxative (Milk of Magnesia or Miralax) should be taken according to package directions if there are no bowel movements after 48 hours. 7. Unless discharge instructions indicate otherwise, you may remove your bandages 24-48 hours after surgery, and you may shower at that time.  You may have steri-strips (small skin tapes) in place directly over the incision.  These strips should be left on the skin for 7-10 days.  If your surgeon used skin glue on the  incision, you may shower in 24 hours.  The glue will flake off over the next 2-3 weeks.  Any sutures or staples will be removed at the office during your follow-up visit. 8. ACTIVITIES:  You may resume regular (light) daily activities beginning the next day--such as daily self-care, walking, climbing stairs--gradually increasing activities as tolerated.  You may have sexual intercourse when it is comfortable.  Refrain from any heavy lifting or straining until approved by your doctor.  a.You may drive when you are no longer taking prescription pain medication, you can comfortably wear a seatbelt, and you can safely maneuver your car and apply brakes. b.RETURN TO WORK:   _____________________________________________  9.You should see your doctor in the office for a follow-up appointment approximately 2-3 weeks after your surgery.  Make sure that you call for this appointment within a day or two after you arrive home to insure a convenient appointment time. 10.OTHER INSTRUCTIONS: ___OK TO SHOWER STARTING TOMORROW ICE PACK, TYLENOL, AND IBUPROFEN ALSO FOR PAIN NO LIFTING MORE THAN 15 POUNDS FOR 4 WEEKS______________________    _____________________________________  WHEN TO CALL YOUR DOCTOR: 1. Fever over 101.0 2. Inability to urinate 3. Nausea and/or vomiting 4. Extreme swelling or bruising 5. Continued bleeding from incision. 6. Increased pain, redness, or drainage from the incision  The clinic staff is available to answer your questions during regular business hours.  Please don't hesitate to call and ask to speak to one of the nurses for clinical concerns.  If you have a medical emergency, go to the nearest  emergency room or call 911.  A surgeon from [facility] is always on call at the hospital   [address], [place], [place], [place]  [address] ?  [address], [place], [place]   [address] [phone] ? [phone] ? FAX [phone] Web site:  [email or web address]   Post Anesthesia Home Care Instructions  Activity: Get plenty of rest for the remainder of the day. A responsible individual must stay with you for 24 hours following the procedure.  For the next 24 hours, DO NOT: -Drive a car -Paediatric nurse -Drink alcoholic beverages -Take any medication unless instructed by your physician -Make any legal decisions or sign important papers.  Meals: Start with liquid foods such as gelatin or soup. Progress to regular foods as tolerated. Avoid greasy, spicy, heavy foods. If nausea and/or vomiting occur, drink only clear liquids until the nausea and/or vomiting subsides. Call your physician if vomiting continues.  Special Instructions/Symptoms: Your throat may feel dry or sore from the anesthesia or the breathing tube placed in your throat during surgery. If this causes discomfort, gargle with warm salt water. The discomfort should disappear within 24 hours.  If you had a scopolamine patch placed behind your ear for the management of post- operative nausea and/or vomiting:  1. The medication in the patch is effective for 72 hours, after which it should be removed.  Wrap patch in a tissue and discard in the trash. Wash hands thoroughly with soap and water. 2. You may remove the patch earlier than 72 hours if you experience unpleasant side effects which may include dry mouth, dizziness or visual disturbances. 3. Avoid touching the patch. Wash your hands with soap and water after contact with the patch.

## 2020-06-16 NOTE — Interval H&P Note (Signed)
History and Physical Interval Note: no change in H and P  06/16/2020 7:06 AM  [place]  has presented today for surgery, with the diagnosis of UMBILICAL HERNIA.  The various methods of treatment have been discussed with the patient and family. After consideration of risks, benefits and other options for treatment, the patient has consented to  Procedure(s) with comments: [place] (N/A) - LMA as a surgical intervention.  The patient's history has been reviewed, patient examined, no change in status, stable for surgery.  I have reviewed the patient's chart and labs.  Questions were answered to the patient's satisfaction.     Joel Moore

## 2020-06-16 NOTE — Anesthesia Procedure Notes (Signed)
Procedure Name: Intubation Performed by: Lariza Cothron M, CRNA Pre-anesthesia Checklist: Patient identified, Emergency Drugs available, Suction available and Patient being monitored Patient Re-evaluated:Patient Re-evaluated prior to induction Oxygen Delivery Method: Circle system utilized Preoxygenation: Pre-oxygenation with 100% oxygen Induction Type: IV induction Ventilation: Mask ventilation without difficulty Laryngoscope Size: Mac and 4 Grade View: Grade I Tube type: Oral Tube size: 7.0 mm Number of attempts: 1 Airway Equipment and Method: Stylet and Oral airway Placement Confirmation: ETT inserted through vocal cords under direct vision,  positive ETCO2 and breath sounds checked- equal and bilateral Tube secured with: Tape Dental Injury: Teeth and Oropharynx as per pre-operative assessment        

## 2020-06-17 ENCOUNTER — Encounter (HOSPITAL_BASED_OUTPATIENT_CLINIC_OR_DEPARTMENT_OTHER): Payer: Self-pay | Admitting: Surgery

## 2020-06-25 NOTE — Progress Notes (Signed)
Patient is a 69 year old male who presented to the emergency department on 05/19/2020 with laceration to the dorsal aspect of his thumb and the tip of his ring finger.  He received suture stitches in the emergency room.  Thumb tendon was found to be intact with normal function.  ~ 5 weeks post injury Patient reports he is doing very well.  Right thumb wound has healed nicely small scab remaining.  No signs of infection or drainage.  He has full range of motion of his thumb.  Slightly reduced thumb grip strength.  Patient reports this continues to improve.  Tip of ring finger is healing well.  Small scab remaining.  No signs of infection or drainage.   Follow-up as needed.  If right thumb grip strength does not fully return we can consider some physical therapy.  Call office with any questions/concerns.  May use Vaseline or other unscented lotion for some moisture.   Pictures were obtained of the patient and placed in the chart with the patient's or guardian's permission.  The [place] was signed into law in 2016 which includes the topic of electronic health records.  This provides immediate access to information in MyChart.  This includes consultation notes, operative notes, office notes, lab results and pathology reports.  If you have any questions about what you read please let us know at your next visit or call us at the office.  We are right here with you.

## 2020-06-26 ENCOUNTER — Encounter: Payer: Self-pay | Admitting: Plastic Surgery

## 2020-06-26 ENCOUNTER — Ambulatory Visit (INDEPENDENT_AMBULATORY_CARE_PROVIDER_SITE_OTHER): Payer: Medicare Other | Admitting: Plastic Surgery

## 2020-06-26 ENCOUNTER — Other Ambulatory Visit: Payer: Self-pay

## 2020-06-26 VITALS — BP 147/85 | HR 57 | Temp 97.7°F

## 2020-06-26 DIAGNOSIS — S61011D Laceration without foreign body of right thumb without damage to nail, subsequent encounter: Secondary | ICD-10-CM | POA: Diagnosis not present

## 2020-10-22 ENCOUNTER — Encounter: Payer: Self-pay | Admitting: Plastic Surgery

## 2020-10-22 ENCOUNTER — Ambulatory Visit: Payer: Medicare Other | Admitting: Plastic Surgery

## 2020-10-22 ENCOUNTER — Other Ambulatory Visit: Payer: Self-pay

## 2020-10-22 VITALS — BP 128/85 | HR 51 | Temp 98.2°F

## 2020-10-22 DIAGNOSIS — S61011D Laceration without foreign body of right thumb without damage to nail, subsequent encounter: Secondary | ICD-10-CM | POA: Diagnosis not present

## 2020-10-22 NOTE — Progress Notes (Signed)
   Referring Provider Kelton Pillar, MD [facility] [address] [place],  [place] [address]   CC:  Chief Complaint  Patient presents with  . Follow-up      Joel Moore is an 69 y.o. male.  HPI: Patient presents 4 to 5 months out from a right thumb laceration.  This healed nicely but over the past several weeks to months he has noticed a catching sensation when he twists his wrist in a certain direction in the area of his scar.  He denies any pain in the [facility] joint or volarly.  He reports fairly good range of motion and is only in certain positions that he feels this pull but when he does feel it is quite painful.  He wants to see if anything can be done about it.  Review of Systems General: Denies fevers and chills  Physical Exam Vitals with BMI 10/22/2020 06/26/2020 06/16/2020  Height - - -  Weight - - -  BMI - - -  Systolic 156 153 794  Diastolic 85 85 77  Pulse 51 57 53    General:  No acute distress,  Alert and oriented, Non-Toxic, Normal speech and affect On examination he has a well-healed scar just dorsal to the MP joint of the right thumb.  He has normal sensation in the thumb normal IP flexion extension.  He has good wrist range of motion otherwise.  The pulling sensation occurs when he fully pronates while extending the thumb.  He has difficulty recreating it today but it only happens sporadically.  Assessment/Plan Patient presents with intermittent painful pulling sensation of dorsal aspect of his right thumb in the area of a previous laceration.  I suspect that this is due to scar tethering between the skin and tendons as it occurs when the thumb extensors are out there greatest stretch.  I am hopeful that has not scar tissue softens that the phenomenon will resolve.  If not I will plan to see him back in a few months and can inject the steroid in this area to hopefully soften that.  The patient was content with this and we will plan to see him again on an as-needed  basis.  Cindra Presume 10/22/2020, 1:14 PM

## 2020-12-05 ENCOUNTER — Ambulatory Visit: Payer: Medicare Other | Attending: Internal Medicine

## 2020-12-05 DIAGNOSIS — Z23 Encounter for immunization: Secondary | ICD-10-CM

## 2020-12-05 NOTE — Progress Notes (Signed)
   Covid-19 Vaccination Clinic  Name:  Joel Moore    MRN: [id number] DOB: [birth year]  12/05/2020  [name] was observed post Covid-19 immunization for 15 minutes without incident. He was provided with Vaccine Information Sheet and instruction to access the V-Safe system.   [name] was instructed to call 911 with any severe reactions post vaccine: Marland Kitchen Difficulty breathing  . Swelling of face and throat  . A fast heartbeat  . A bad rash all over body  . Dizziness and weakness   Immunizations Administered    Name Date Dose VIS Date Route   Moderna Covid-19 Booster Vaccine 12/05/2020 10:21 AM 0.25 mL 10/07/2020 Intramuscular   Manufacturer: Levan Hurst   Lot: [id number]   [place]: [id number]

## 2021-02-02 DIAGNOSIS — D225 Melanocytic nevi of trunk: Secondary | ICD-10-CM | POA: Diagnosis not present

## 2021-02-02 DIAGNOSIS — L814 Other melanin hyperpigmentation: Secondary | ICD-10-CM | POA: Diagnosis not present

## 2021-02-02 DIAGNOSIS — L218 Other seborrheic dermatitis: Secondary | ICD-10-CM | POA: Diagnosis not present

## 2021-02-02 DIAGNOSIS — D1801 Hemangioma of skin and subcutaneous tissue: Secondary | ICD-10-CM | POA: Diagnosis not present

## 2021-02-02 DIAGNOSIS — L738 Other specified follicular disorders: Secondary | ICD-10-CM | POA: Diagnosis not present

## 2021-02-02 DIAGNOSIS — L2084 Intrinsic (allergic) eczema: Secondary | ICD-10-CM | POA: Diagnosis not present

## 2021-03-25 DIAGNOSIS — J069 Acute upper respiratory infection, unspecified: Secondary | ICD-10-CM | POA: Diagnosis not present

## 2021-03-27 DIAGNOSIS — J40 Bronchitis, not specified as acute or chronic: Secondary | ICD-10-CM | POA: Diagnosis not present

## 2021-04-13 DIAGNOSIS — J019 Acute sinusitis, unspecified: Secondary | ICD-10-CM | POA: Diagnosis not present

## 2021-04-13 DIAGNOSIS — J309 Allergic rhinitis, unspecified: Secondary | ICD-10-CM | POA: Diagnosis not present

## 2021-04-28 NOTE — Progress Notes (Signed)
NEUROLOGY CONSULTATION NOTE  Joel Moore MRN: [id number] DOB: 12-Mar-1951  Referring provider: Kelton Pillar, MD Primary care provider: Kelton Pillar, MD  Reason for consult:  vertigo  Assessment/Plan:   1.  I suspect left sided benign paroxysmal positional vertigo.  Given that he has some neck pain/stiffness, consider cervicogenic dizziness as well.   2.  Right-sided Meniere's disease  1.  I will refer him again for another round of vestibular rehab.  If ineffective, we can pursue other testing, such as imaging. 2.  Follow up after completion of physical therapy   Subjective:  Joel Moore is a 70 year old  male with right-sided Meneire's disease who presents for worsening vertigo.  History supplemented by ENT and PCP notes.  Patient was diagnosed with right-sided Meniere's disease in 2012 for which he takes HCTZ.  At that time, he had an MRI of the brain with and without contrast, which was personally reviewed and was unremarkable.  For several years, he would periodically experience a different type of dizziness not typical of the vertigo.  He describes a lightheadedness, not a sensation of going to pass out but rather a slow undulating sensation of movement.  It occurs when he moves his head a certain way, usually to the left, or if he lays his head back with pressure on the back of his head on the recliner.  It often occurs when he just gets up from bed.  It can last from 30 minutes to a few hours.  However, it has been chronic since November 2021.  He followed up with ENT in December who suspected orthostatic hypotension and recommended increasing water intake and sitting a minute or 2 before standing up.  Around that time, he did go to physical therapy, however he wasn't feeling dizzy and the therapist was unable to reproduce it.  He says he had a carotid doppler that was reportedly unremarkable.  He has prediabetes. Hgb A1c from last July was 6.1.  CMP unremarkable.  PAST  MEDICAL HISTORY: Past Medical History:  Diagnosis Date  . High cholesterol   . Meniere disease   . Umbilical hernia     PAST SURGICAL HISTORY: Past Surgical History:  Procedure Laterality Date  . INSERTION OF MESH N/A 06/16/2020   Procedure: INSERTION OF MESH;  Surgeon: Coralie Keens, MD;  Location: [place] [facility];  Service: General;  Laterality: N/A;  . KNEE ARTHROSCOPY  1980's  . KNEE SURGERY Left   . UMBILICAL HERNIA REPAIR N/A 06/16/2020   Procedure: UMBILICAL HERNIA REPAIR WITH MESH;  Surgeon: Coralie Keens, MD;  Location: [facility];  Service: General;  Laterality: N/A;  LMA    MEDICATIONS: Current Outpatient Medications on File Prior to Visit  Medication Sig Dispense Refill  . aspirin 81 MG EC tablet Take by mouth.    Marland Kitchen atorvastatin (LIPITOR) 20 MG tablet     . chlorthalidone (HYGROTON) 50 MG tablet     . Multiple Vitamin (MULTIVITAMIN) capsule Take 1 capsule by mouth daily.    Marland Kitchen oxyCODONE (OXY IR/ROXICODONE) 5 MG immediate release tablet Take 1 tablet (5 mg total) by mouth every 6 (six) hours as needed for moderate pain or severe pain. (Patient not taking: Reported on 10/22/2020) 20 tablet 0   No current facility-administered medications on file prior to visit.    ALLERGIES: No Known Allergies  FAMILY HISTORY: Family History  Problem Relation Age of Onset  . Diabetes Mother     Objective:  Blood pressure (!) 160/100, pulse 83, resp. rate 18, height 5\' 7"  (1.702 m), weight 223 lb (101.2 kg), SpO2 97 %. General: No acute distress.  Patient appears well-groomed.   Head:  Normocephalic/atraumatic Head Impulse Test:  Positive to the left Eyes:  fundi examined but not visualized Neck: supple, no paraspinal tenderness, full range of motion Back: No paraspinal tenderness Heart: regular rate and rhythm Lungs: Clear to auscultation bilaterally. Vascular: No carotid bruits. Neurological Exam: Mental status: alert and oriented to person,  place, and time, recent and remote memory intact, fund of knowledge intact, attention and concentration intact, speech fluent and not dysarthric, language intact. Cranial nerves: CN I: not tested CN II: pupils equal, round and reactive to light, visual fields intact CN III, IV, VI:  full range of motion, no nystagmus, no ptosis CN V: facial sensation intact. CN VII: upper and lower face symmetric CN VIII: hearing intact CN IX, X: gag intact, uvula midline CN XI: sternocleidomastoid and trapezius muscles intact CN XII: tongue midline Bulk & Tone: normal, no fasciculations. Motor:  muscle strength 5/5 throughout Sensation:  Pinprick, temperature and vibratory sensation intact. Deep Tendon Reflexes:  2+ throughout,  toes downgoing.   Finger to nose testing:  Without dysmetria.   Heel to shin:  Without dysmetria.   Gait:  Normal station and stride.  Romberg negative.    Thank you for allowing me to take part in the care of this patient.  Metta Clines, DO  CC: Kelton Pillar, MD

## 2021-04-29 ENCOUNTER — Other Ambulatory Visit: Payer: Self-pay

## 2021-04-29 ENCOUNTER — Ambulatory Visit: Payer: Medicare Other | Admitting: Neurology

## 2021-04-29 ENCOUNTER — Encounter: Payer: Self-pay | Admitting: Neurology

## 2021-04-29 VITALS — BP 160/100 | HR 83 | Resp 18 | Ht 67.0 in | Wt 223.0 lb

## 2021-04-29 DIAGNOSIS — H8101 Meniere's disease, right ear: Secondary | ICD-10-CM

## 2021-04-29 DIAGNOSIS — R42 Dizziness and giddiness: Secondary | ICD-10-CM | POA: Diagnosis not present

## 2021-04-29 NOTE — Patient Instructions (Signed)
My suspicion is that it is still an inner ear issue of the left ear. I will refer you for another round of vestibular rehab.  If no improvement, contact me and we will pursue imaging Follow up after vestibular rehab

## 2021-05-20 ENCOUNTER — Ambulatory Visit: Payer: Medicare Other | Attending: Neurology | Admitting: Physical Therapy

## 2021-05-20 ENCOUNTER — Encounter: Payer: Self-pay | Admitting: Physical Therapy

## 2021-05-20 ENCOUNTER — Other Ambulatory Visit: Payer: Self-pay

## 2021-05-20 DIAGNOSIS — H8112 Benign paroxysmal vertigo, left ear: Secondary | ICD-10-CM | POA: Diagnosis not present

## 2021-05-20 NOTE — Patient Instructions (Signed)
Benign Positional Vertigo Vertigo is the feeling that you or your surroundings are moving when they are not. Benign positional vertigo is the most common form of vertigo. This is usually a harmless condition (benign). This condition is positional. This means that symptoms are triggered by certain movements and positions. This condition can be dangerous if it occurs while you are doing something that could cause harm to you or others. This includes activities such as driving or operating machinery. What are the causes? The inner ear has fluid-filled canals that help your brain sense movement and balance. When the fluid moves, the brain receives messages about your body's position. With benign positional vertigo, crystals in the inner ear break free and disturb the inner ear area. This causes your brain to receive confusing messages about your body's position. What increases the risk? You are more likely to develop this condition if:  You are a woman.  You are 41 years of age or older.  You have recently had a head injury.  You have an inner ear disease. What are the signs or symptoms? Symptoms of this condition usually happen when you move your head or your eyes in different directions. Symptoms may start suddenly, and usually last for less than a minute. They include:  Loss of balance and falling.  Feeling like you are spinning or moving.  Feeling like your surroundings are spinning or moving.  Nausea and vomiting.  Blurred vision.  Dizziness.  Involuntary eye movement (nystagmus). Symptoms can be mild and cause only minor problems, or they can be severe and interfere with daily life. Episodes of benign positional vertigo may return (recur) over time. Symptoms may improve over time. How is this diagnosed? This condition may be diagnosed based on:  Your medical history.  Physical exam of the head, neck, and ears.  Positional tests to check for or stimulate vertigo. You may  be asked to turn your head and change positions, such as going from sitting to lying down. A health care provider will watch for symptoms of vertigo. You may be referred to a health care provider who specializes in ear, nose, and throat problems (ENT, or otolaryngologist) or a provider who specializes in disorders of the nervous system (neurologist). How is this treated? This condition may be treated in a session in which your health care provider moves your head in specific positions to help the displaced crystals in your inner ear move. Treatment for this condition may take several sessions. Surgery may be needed in severe cases, but this is rare. In some cases, benign positional vertigo may resolve on its own in 2-4 weeks.   Follow these instructions at home: Safety  Move slowly. Avoid sudden body or head movements or certain positions, as told by your health care provider.  Avoid driving until your health care provider says it is safe for you to do so.  Avoid operating heavy machinery until your health care provider says it is safe for you to do so.  Avoid doing any tasks that would be dangerous to you or others if vertigo occurs.  If you have trouble walking or keeping your balance, try using a cane for stability. If you feel dizzy or unstable, sit down right away.  Return to your normal activities as told by your health care provider. Ask your health care provider what activities are safe for you. General instructions  Take over-the-counter and prescription medicines only as told by your health care provider.  Drink enough  fluid to keep your urine pale yellow.  Keep all follow-up visits as told by your health care provider. This is important. Contact a health care provider if:  You have a fever.  Your condition gets worse or you develop new symptoms.  Your family or friends notice any behavioral changes.  You have nausea or vomiting that gets worse.  You have numbness or a  prickling and tingling sensation. Get help right away if you:  Have difficulty speaking or moving.  Are always dizzy.  Faint.  Develop severe headaches.  Have weakness in your legs or arms.  Have changes in your hearing or vision.  Develop a stiff neck.  Develop sensitivity to light. Summary  Vertigo is the feeling that you or your surroundings are moving when they are not. Benign positional vertigo is the most common form of vertigo.  This condition is caused by crystals in the inner ear that become displaced. This causes a disturbance in an area of the inner ear that helps your brain sense movement and balance.  Symptoms include loss of balance and falling, feeling that you or your surroundings are moving, nausea and vomiting, and blurred vision.  This condition can be diagnosed based on symptoms, a physical exam, and positional tests.  Follow safety instructions as told by your health care provider. You will also be told when to contact your health care provider in case of problems. This information is not intended to replace advice given to you by your health care provider. Make sure you discuss any questions you have with your health care provider. Document Revised: 10/29/2019 Document Reviewed: 05/16/2018 Elsevier Patient Education  2021 [employer].      How to Perform the Epley Maneuver The Epley maneuver is an exercise that relieves symptoms of vertigo. Vertigo is the feeling that you or your surroundings are moving when they are not. When you feel vertigo, you may feel like the room is spinning and may have trouble walking. The Epley maneuver is used for a type of vertigo caused by a calcium deposit in a part of the inner ear. The maneuver involves changing head positions to help the deposit move out of the area. You can do this maneuver at home whenever you have symptoms of vertigo. You can repeat it in 24 hours if your vertigo has not gone away. Even though the  Epley maneuver may relieve your vertigo for a few weeks, it is possible that your symptoms will return. This maneuver relieves vertigo, but it does not relieve dizziness. What are the risks? If it is done correctly, the Epley maneuver is considered safe. Sometimes it can lead to dizziness or nausea that goes away after a short time. If you develop other symptoms--such as changes in vision, weakness, or numbness--stop doing the maneuver and call your health care provider. Supplies needed:  A bed or table.  A pillow. How to do the Epley maneuver 1. Sit on the edge of a bed or table with your back straight and your legs extended or hanging over the edge of the bed or table. 2. Turn your head halfway toward the affected ear or side as told by your health care provider. 3. Lie backward quickly with your head turned until you are lying flat on your back. You may want to position a pillow under your shoulders. 4. Hold this position for at least 30 seconds. If you feel dizzy or have symptoms of vertigo, continue to hold the position until the  symptoms stop. 5. Turn your head to the opposite direction until your unaffected ear is facing the floor. 6. Hold this position for at least 30 seconds. If you feel dizzy or have symptoms of vertigo, continue to hold the position until the symptoms stop. 7. Turn your whole body to the same side as your head so that you are positioned on your side. Your head will now be nearly facedown. Hold for at least 30 seconds. If you feel dizzy or have symptoms of vertigo, continue to hold the position until the symptoms stop. 8. Sit back up. You can repeat the maneuver in 24 hours if your vertigo does not go away.      Follow these instructions at home: For 24 hours after doing the Epley maneuver:  Keep your head in an upright position.  When lying down to sleep or rest, keep your head raised (elevated) with two or more pillows.  Avoid excessive neck  movements. Activity  Do not drive or use machinery if you feel dizzy.  After doing the Epley maneuver, return to your normal activities as told by your health care provider. Ask your health care provider what activities are safe for you. General instructions  Drink enough fluid to keep your urine pale yellow.  Do not drink alcohol.  Take over-the-counter and prescription medicines only as told by your health care provider.  Keep all follow-up visits as told by your health care provider. This is important. Preventing vertigo symptoms Ask your health care provider if there is anything you should do at home to prevent vertigo. He or she may recommend that you:  Keep your head elevated with two or more pillows while you sleep.  Do not sleep on the side of your affected ear.  Get up slowly from bed.  Avoid sudden movements during the day.  Avoid extreme head positions or movement, such as looking up or bending over. Contact a health care provider if:  Your vertigo gets worse.  You have other symptoms, including: ? Nausea. ? Vomiting. ? Headache. Get help right away if you:  Have vision changes.  Have a headache or neck pain that is severe or getting worse.  Cannot stop vomiting.  Have new numbness or weakness in any part of your body. Summary  Vertigo is the feeling that you or your surroundings are moving when they are not.  The Epley maneuver is an exercise that relieves symptoms of vertigo.  If the Epley maneuver is done correctly, it is considered safe and relieves vertigo quickly. This information is not intended to replace advice given to you by your health care provider. Make sure you discuss any questions you have with your health care provider. Document Revised: 10/02/2019 Document Reviewed: 10/02/2019 Elsevier Patient Education  [place] for Left Posterior / Anterior Canalithiasis    Sitting on bed: 1. Turn head 45 left.  (a) Lie back slowly, shoulders on pillow, head on bed. (b) Hold _20-30___ seconds. 2. Keeping head on bed, turn head 90 right. Hold _20-30___ seconds. 3. Roll to right, head on 45 angle down toward bed. Hold _20-30___ seconds. 4. Sit up on right side of bed. Repeat _3___ times per session. Do _1-2___ sessions per day.  Copyright  VHI. All rights reserved.

## 2021-05-21 NOTE — Therapy (Signed)
[facility] [address] [place], [place], [address] Phone: [phone]   Fax:  [phone]  Physical Therapy Evaluation  Patient Details  Name: Joel Moore MRN: [id number] Date of Birth: 1951/08/20 Referring Provider (PT): Metta Clines, DO   Encounter Date: 05/20/2021   PT End of Session - 05/21/21 1445    Visit Number 1    Authorization Type [employer]    PT Start Time 0802    PT Stop Time 0846    PT Time Calculation (min) 44 min    Activity Tolerance Patient tolerated treatment well    Behavior During Therapy [facility] for tasks assessed/performed           Past Medical History:  Diagnosis Date  . High cholesterol   . Meniere disease   . Umbilical hernia     Past Surgical History:  Procedure Laterality Date  . INSERTION OF MESH N/A 06/16/2020   Procedure: INSERTION OF MESH;  Surgeon: Coralie Keens, MD;  Location: [place];  Service: General;  Laterality: N/A;  . KNEE ARTHROSCOPY  1980's  . KNEE SURGERY Left   . UMBILICAL HERNIA REPAIR N/A 06/16/2020   Procedure: UMBILICAL HERNIA REPAIR WITH MESH;  Surgeon: Coralie Keens, MD;  Location: [facility];  Service: General;  Laterality: N/A;  LMA    There were no vitals filed for this visit.    Subjective Assessment - 05/20/21 0757    Subjective Vertigo has improved within past week - was bad approx. 3-4 months ago but has improved within past week; states dizziness was worse in the mornings and then improved as day progressed    Pertinent History Meniere's disease Rt ear - diagnosed approx. 5 yrs ago    Patient Stated Goals "anything that will help keep it away"    Currently in Pain? No/denies              [facility] PT Assessment - 05/21/21 0001      Assessment   Medical Diagnosis BPPV    Referring Provider (PT) Metta Clines, DO    Onset Date/Surgical Date --   March 2022   Prior Therapy pt received PT at another facility in town  - unable to recall name of provider      Precautions   Precautions None      Balance Screen   Has the patient fallen in the past 6 months No    Has the patient had a decrease in activity level because of a fear of falling?  No    Is the patient reluctant to leave their home because of a fear of falling?  No      Prior Function   Level of Independence Independent                  Vestibular Assessment - 05/21/21 0001      Symptom Behavior   Subjective history of current problem pt states vertigo has mostly resolved in past couple of weeks    Type of Dizziness  Spinning    Frequency of Dizziness previously was daily - has not had any dizziness in past couple of weeks    Duration of Dizziness secs to mins.    Symptom Nature Positional    Aggravating Factors Turning head quickly;Rolling to left;Mornings    Relieving Factors Head stationary    Progression of Symptoms Better      Positional Testing   Dix-Hallpike Dix-Hallpike Right;Dix-Hallpike Left  Sidelying Test Sidelying Right;Sidelying Left      Dix-Hallpike Right   Dix-Hallpike Right Duration none    Dix-Hallpike Right Symptoms No nystagmus      Dix-Hallpike Left   Dix-Hallpike Left Duration none    Dix-Hallpike Left Symptoms No nystagmus      Sidelying Right   Sidelying Right Duration none    Sidelying Right Symptoms No nystagmus      Sidelying Left   Sidelying Left Duration none    Sidelying Left Symptoms No nystagmus              Objective measurements completed on examination: See above findings.               PT Education - 05/21/21 1429    Education Details Pt instructed in Epley maneuver for self treatment prn should BPPV reoccur in future; gave info on etiology of BPPV    Person(s) Educated Patient    Methods Explanation;Handout;Demonstration    Comprehension Verbalized understanding;Returned demonstration               PT Long Term Goals - 05/21/21 1455      PT LONG  TERM GOAL #1   Title N/A - eval only                  Plan - 05/21/21 1450    Clinical Impression Statement Pt presents with no c/o vertigo at this time - all positional testing (-) with no nystagmus and no c/o dizziness reported.  Pt's symptoms are consistent with Lt BPPV which has resolved  as of this time.    Stability/Clinical Decision Making Stable/Uncomplicated    Clinical Decision Making Low    Rehab Potential --   N/A - eval only   PT Frequency One time visit    PT Treatment/Interventions Patient/family education;ADLs/Self Care Home Management;Vestibular;Canalith Repostioning    PT Next Visit Plan N/A - eval only as  pt has no c/o vertigo at this time    Consulted and Agree with Plan of Care Patient           Patient will benefit from skilled therapeutic intervention in order to improve the following deficits and impairments:  Dizziness  Visit Diagnosis: BPPV (benign paroxysmal positional vertigo), left - Plan: PT plan of care cert/re-cert     Problem List There are no problems to display for this patient.   [name], PT 05/21/2021, 2:58 PM  [facility] [address] [place], [place], [address] Phone: [phone]   Fax:  [phone]  Name: Joel Moore MRN: [id number] Date of Birth: 1951/04/21

## 2021-06-30 DIAGNOSIS — H8109 Meniere's disease, unspecified ear: Secondary | ICD-10-CM | POA: Diagnosis not present

## 2021-06-30 DIAGNOSIS — E7800 Pure hypercholesterolemia, unspecified: Secondary | ICD-10-CM | POA: Diagnosis not present

## 2021-06-30 DIAGNOSIS — Z1389 Encounter for screening for other disorder: Secondary | ICD-10-CM | POA: Diagnosis not present

## 2021-06-30 DIAGNOSIS — J309 Allergic rhinitis, unspecified: Secondary | ICD-10-CM | POA: Diagnosis not present

## 2021-06-30 DIAGNOSIS — Z0000 Encounter for general adult medical examination without abnormal findings: Secondary | ICD-10-CM | POA: Diagnosis not present

## 2021-06-30 DIAGNOSIS — R7303 Prediabetes: Secondary | ICD-10-CM | POA: Diagnosis not present

## 2021-11-05 ENCOUNTER — Other Ambulatory Visit: Payer: Self-pay | Admitting: Internal Medicine

## 2021-11-05 DIAGNOSIS — E785 Hyperlipidemia, unspecified: Secondary | ICD-10-CM

## 2021-11-30 ENCOUNTER — Ambulatory Visit
Admission: RE | Admit: 2021-11-30 | Discharge: 2021-11-30 | Disposition: A | Discharge status: Home / Self Care | Payer: No Typology Code available for payment source | Source: Ambulatory Visit | Attending: Internal Medicine | Admitting: Internal Medicine

## 2021-11-30 DIAGNOSIS — E785 Hyperlipidemia, unspecified: Secondary | ICD-10-CM

## 2021-12-22 IMAGING — DX DG HAND COMPLETE 3+V*R*
3 series · 3 of 3 positions shown · non-contrast
Comparison: None.

CLINICAL DATA: Right hand laceration on a table saw today Initial
encounter.

EXAM:
RIGHT HAND - COMPLETE 3+ VIEW

[hand pa]
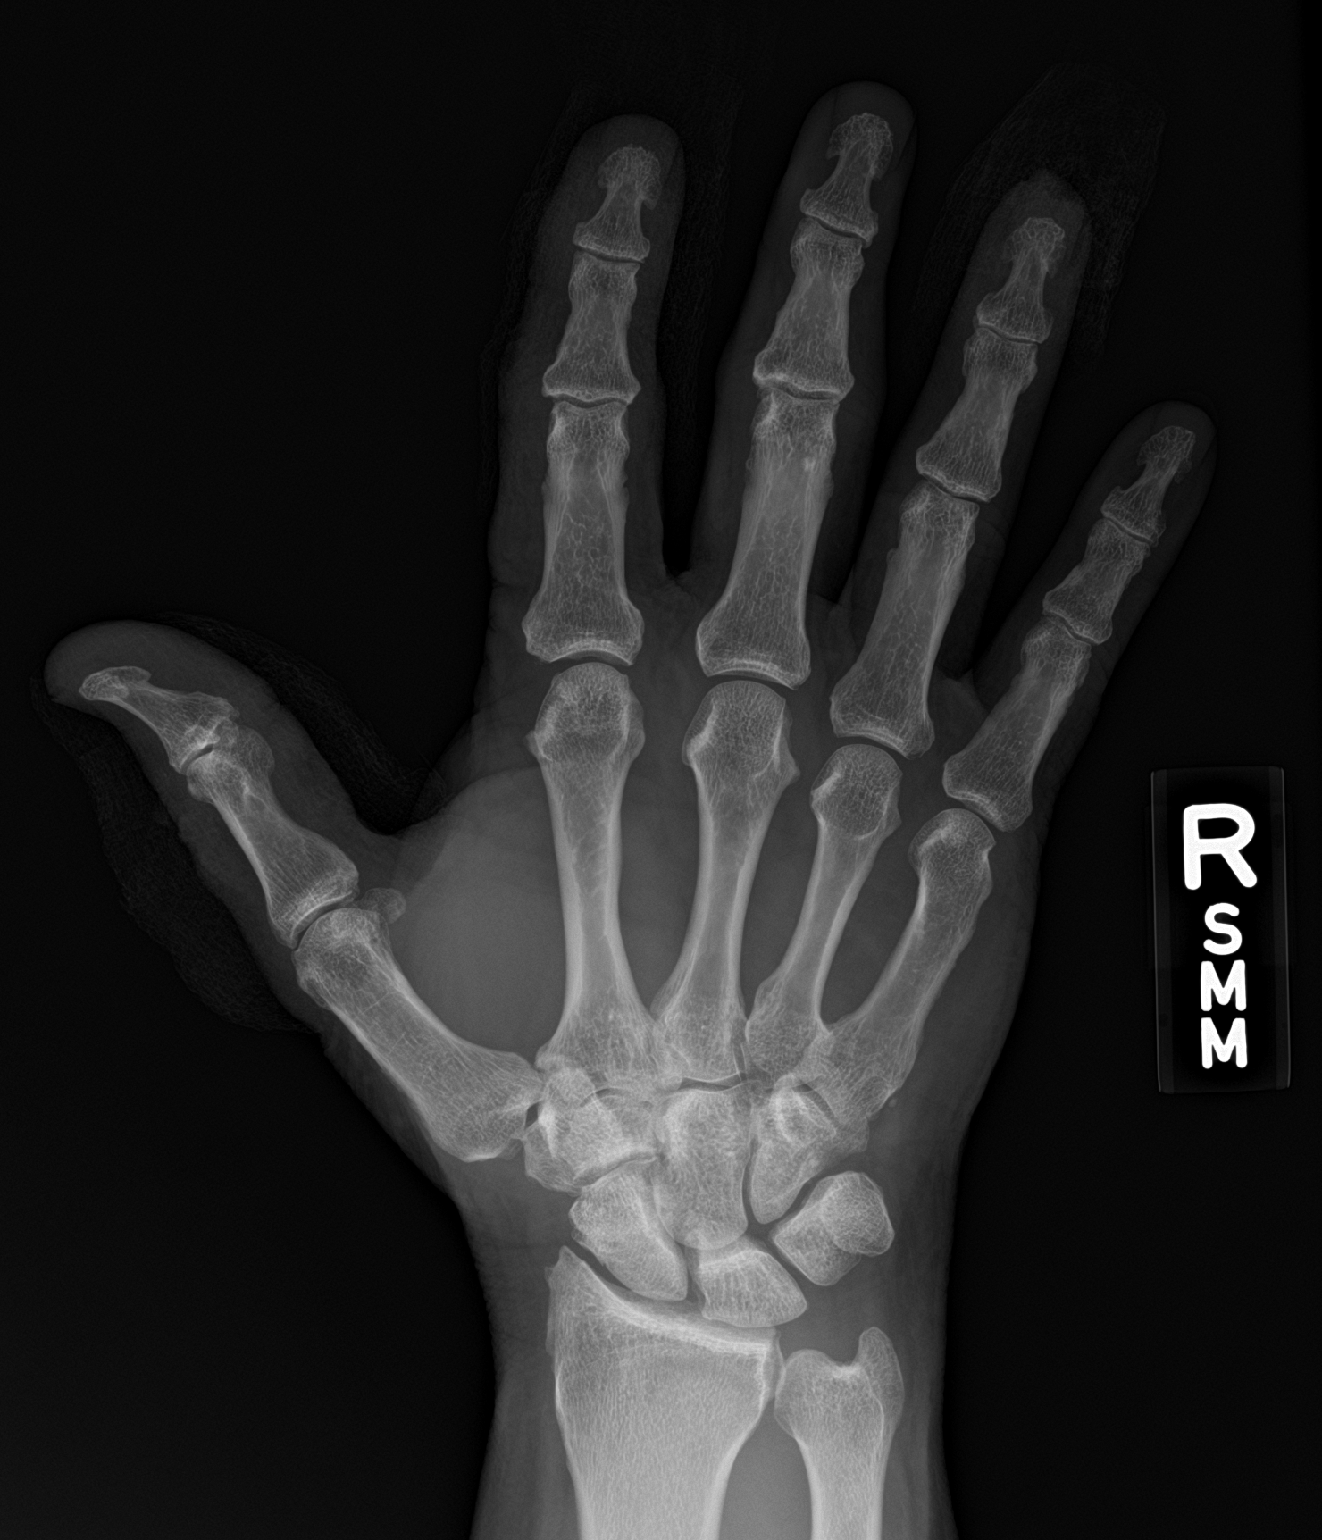

[hand obl]
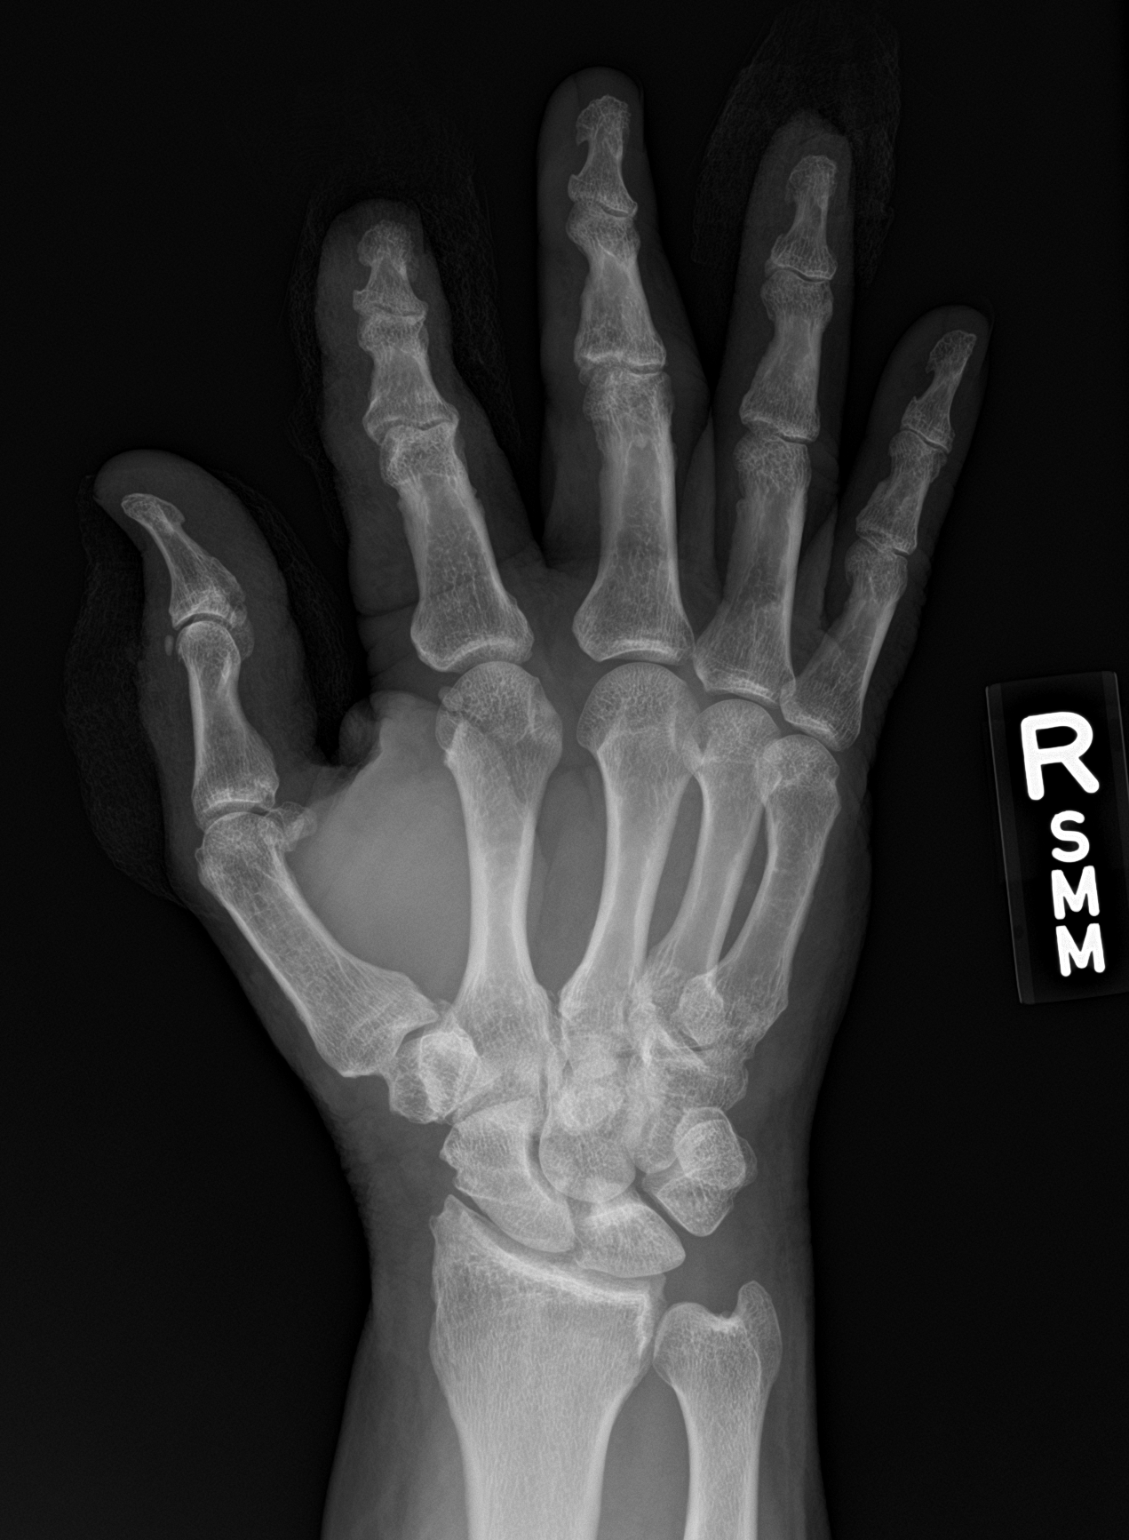

[hand lat]
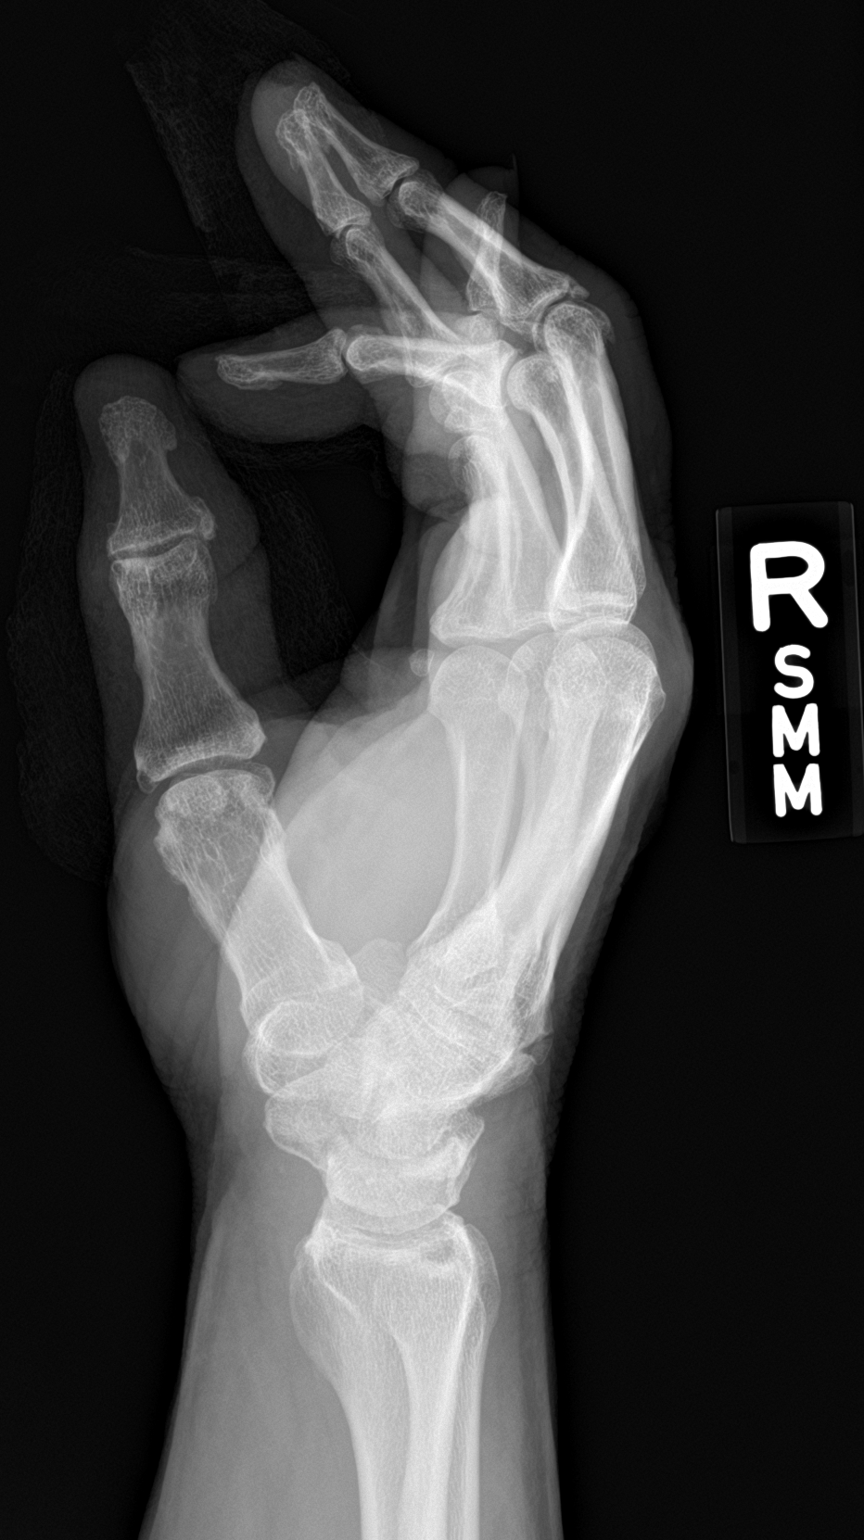

[3 of 3 positions shown; findings below may reference images not displayed]

FINDINGS: No acute bony or joint abnormality or radiopaque foreign body is
identified. Scattered osteoarthritis is noted. Bandaging is seen
about the thumb, index and ring fingers.
IMPRESSION: Negative for fracture or radiopaque foreign body.

## 2022-02-24 ENCOUNTER — Institutional Professional Consult (permissible substitution): Payer: Medicare Other | Admitting: Neurology

## 2022-04-13 ENCOUNTER — Encounter: Payer: Self-pay | Admitting: *Deleted

## 2022-04-14 ENCOUNTER — Ambulatory Visit: Payer: Medicare Other | Admitting: Neurology

## 2022-04-14 ENCOUNTER — Encounter: Payer: Self-pay | Admitting: Neurology

## 2022-04-14 VITALS — BP 127/76 | HR 59 | Ht 67.0 in | Wt 229.2 lb

## 2022-04-14 DIAGNOSIS — E669 Obesity, unspecified: Secondary | ICD-10-CM | POA: Diagnosis not present

## 2022-04-14 DIAGNOSIS — R0681 Apnea, not elsewhere classified: Secondary | ICD-10-CM

## 2022-04-14 DIAGNOSIS — R0683 Snoring: Secondary | ICD-10-CM | POA: Diagnosis not present

## 2022-04-14 DIAGNOSIS — G4719 Other hypersomnia: Secondary | ICD-10-CM | POA: Diagnosis not present

## 2022-04-14 DIAGNOSIS — R351 Nocturia: Secondary | ICD-10-CM

## 2022-04-14 NOTE — Progress Notes (Signed)
Subjective:  ?  ?Patient ID: Joel Moore is a 71 y.o. male. ? ?HPI ? ? ? ?Star Age, MD, PhD ?[facility] ?[address], [address] ?[phone] ?[place], [place] [address] ? ?Dear Dr. Virgina Jock,  ? ?I saw your patient, Joel Moore, upon your kind request, in my sleep clinic today for initial consultation of his Sleep disorder, in particular, concern for underlying obstructive sleep apnea.  The patient is unaccompanied today.  As you know, Joel Moore is a 71 year old right-handed gentleman with an underlying medical history of reflux disease, hyperlipidemia, osteoarthritis, prediabetes, dizziness (for which she saw Dr. Tomi Moore in May 2022), M?ni?re's disease, and obesity, who reports snoring and excessive daytime somnolence.  His Epworth sleepiness score is 13 out of 24, fatigue severity score is 18 out of 63.  He has had witnessed apneas per wife's report.  He lives with his wife.  He is retired.  He denies morning headaches but has nocturia about once or twice per average night.  He goes to bed around 11 PM and rise time is between 7 AM and 7:30 AM.  He works for the [employer].  They have 2 grown children, no pets in the household.  He does not have a TV in the bedroom.  He has a half brother with sleep apnea.  He quit smoking in 1974, he drinks alcohol about 3-4 times a week, no daily caffeine.  He is working on weight loss.  ? ?His Past Medical History Is Significant For: ?Past Medical History:  ?Diagnosis Date  ? GERD (gastroesophageal reflux disease)   ? High cholesterol   ? Meniere disease   ? OA (osteoarthritis)   ? fingers and L knee, L5-S1 DDD  ? Pre-diabetes   ? Umbilical hernia   ? ? ?His Past Surgical History Is Significant For: ?Past Surgical History:  ?Procedure Laterality Date  ? CATARACT EXTRACTION    ? Dr. Talbert Forest  ? INSERTION OF MESH N/A 06/16/2020  ? Procedure: INSERTION OF MESH;  Surgeon: Coralie Keens, MD;  Location: [place];  Service: General;   Laterality: N/A;  ? KNEE ARTHROSCOPY  1980's  ? KNEE SURGERY Left   ? UMBILICAL HERNIA REPAIR N/A 06/16/2020  ? Procedure: UMBILICAL HERNIA REPAIR WITH MESH;  Surgeon: Coralie Keens, MD;  Location: [place];  Service: General;  Laterality: N/A;  LMA  ? ? ?His Family History Is Significant For: ?Family History  ?Problem Relation Age of Onset  ? Diabetes Mother   ? Dementia Father   ? Cancer Father   ? Sleep apnea Neg Hx   ? ? ?His Social History Is Significant For: ?Social History  ? ?Socioeconomic History  ? Marital status: Married  ?  Spouse name: Not on file  ? Number of children: Not on file  ? Years of education: Not on file  ? Highest education level: Not on file  ?Occupational History  ? Not on file  ?Tobacco Use  ? Smoking status: Never  ? Smokeless tobacco: Never  ?Vaping Use  ? Vaping Use: Never used  ?Substance and Sexual Activity  ? Alcohol use: Yes  ?  Alcohol/week: 5.0 standard drinks  ?  Types: 5 Cans of beer per week  ? Drug use: Never  ? Sexual activity: Not on file  ?Other Topics Concern  ? Not on file  ?Social History Narrative  ? Right handed  ? One story home  ? Occasionally caffeine  ? ?  Social Determinants of Health  ? ?Financial Resource Strain: Not on file  ?Food Insecurity: Not on file  ?Transportation Needs: Not on file  ?Physical Activity: Not on file  ?Stress: Not on file  ?Social Connections: Not on file  ? ? ?His Allergies Are:  ?No Known Allergies:  ? ?His Current Medications Are:  ?Outpatient Encounter Medications as of 04/14/2022  ?Medication Sig  ? Albuterol Sulfate (PROAIR RESPICLICK) 852 (90 Base) MCG/ACT AEPB 1 puff as needed  ? aspirin 81 MG EC tablet Take by mouth.  ? atorvastatin (LIPITOR) 20 MG tablet   ? Fexofenadine-Pseudoephedrine (ALLEGRA-D 12 HOUR PO)   ? glucosamine-chondroitin 500-400 MG tablet Take 1 tablet by mouth 3 (three) times daily.  ? hydrochlorothiazide (HYDRODIURIL) 25 MG tablet 1 tablet in the morning  ? Multiple Vitamin (MULTIVITAMIN)  capsule Take 1 capsule by mouth daily.  ? oxyCODONE (OXY IR/ROXICODONE) 5 MG immediate release tablet Take 1 tablet (5 mg total) by mouth every 6 (six) hours as needed for moderate pain or severe pain.  ? ?No facility-administered encounter medications on file as of 04/14/2022.  ?: ? ? ?Review of Systems:  ?Out of a complete 14 point review of systems, all are reviewed and negative with the exception of these symptoms as listed below: ? ?Review of Systems  ?Neurological:   ?     Pt here for sleep consult pt states he snore,headaches,fatigue . Pt denies sleep study,and CPAP machine ,hypertension  ? ?ESS:13 ?FSS :18  ? ?Objective:  ?Neurological Exam ? ?Physical Exam ?Physical Examination:  ? ?Vitals:  ? 04/14/22 0959  ?BP: 127/76  ?Pulse: (!) 59  ? ? ?General Examination: The patient is a very pleasant 71 y.o. male in no acute distress. He appears well-developed and well-nourished and well groomed.  ? ?HEENT: Normocephalic, atraumatic, pupils are equal, round and reactive to light, extraocular tracking is good without limitation to gaze excursion or nystagmus noted. Hearing is grossly intact. Face is symmetric with normal facial animation. Speech is clear with no dysarthria noted. There is no hypophonia. There is no lip, neck/head, jaw or voice tremor. Neck is supple with full range of passive and active motion. There are no carotid bruits on auscultation. Oropharynx exam reveals: Arthritis, adequate dental hygiene.  Mallampati class III.  Neck circumference of 17-7/8 inches.  He has a minimal overbite.  Tonsillar size of about 1+ and larger uvula noted, overall moderate airway crowding.  Tongue protrudes centrally and palate elevates symmetrically.   ? ?Chest: Clear to auscultation without wheezing, rhonchi or crackles noted. ? ?Heart: S1+S2+0, regular and normal without murmurs, rubs or gallops noted.  ? ?Abdomen: Soft, non-tender and non-distended. ? ?Extremities: There is no pitting edema in the distal lower  extremities bilaterally.  ? ?Skin: Warm and dry without trophic changes noted.  ? ?Musculoskeletal: exam reveals no obvious joint deformities.  ? ?Neurologically:  ?Mental status: The patient is awake, alert and oriented in all 4 spheres. His immediate and remote memory, attention, language skills and fund of knowledge are appropriate. There is no evidence of aphasia, agnosia, apraxia or anomia. Speech is clear with normal prosody and enunciation. Thought process is linear. Mood is normal and affect is normal.  ?Cranial nerves II - XII are as described above under HEENT exam.  ?Motor exam: Normal bulk, strength and tone is noted. There is no tremor, Romberg is negative. Reflexes are 2+ throughout. Fine motor skills and coordination: grossly intact.  ?Cerebellar testing: No dysmetria or intention tremor. There  is no truncal or gait ataxia.  ?Sensory exam: intact to light touch in the upper and lower extremities.  ?Gait, station and balance: He stands easily. No veering to one side is noted. No leaning to one side is noted. Posture is age-appropriate and stance is narrow based. Gait shows normal stride length and normal pace. No problems turning are noted.  ? ?Assessment and Plan:  ?In summary, Joel Moore is a very pleasant 71 y.o.-year old male with an underlying medical history of reflux disease, hyperlipidemia, osteoarthritis, prediabetes, dizziness (for which she saw Dr. Tomi Moore in May 2022), M?ni?re's disease, and obesity, whose history and physical exam are concerning for obstructive sleep apnea (OSA). ?I had a long chat with the patient about my findings and the diagnosis of OSA, its prognosis and treatment options. We talked about medical treatments, surgical interventions and non-pharmacological approaches. I explained in particular the risks and ramifications of untreated moderate to severe OSA, especially with respect to developing cardiovascular disease down the Road, including congestive heart failure,  difficult to treat hypertension, cardiac arrhythmias, or stroke. Even type 2 diabetes has, in part, been linked to untreated OSA. Symptoms of untreated OSA include daytime sleepiness, memory problems, mood irri

## 2022-04-14 NOTE — Patient Instructions (Signed)

## 2022-04-19 ENCOUNTER — Telehealth: Payer: Self-pay

## 2022-04-19 NOTE — Telephone Encounter (Signed)
LVM for pt to call me back to schedule sleep study  

## 2022-04-28 ENCOUNTER — Telehealth: Payer: Self-pay

## 2022-04-28 NOTE — Telephone Encounter (Signed)
LVM for pt to call me back to schedule sleep study  

## 2022-05-10 ENCOUNTER — Ambulatory Visit (INDEPENDENT_AMBULATORY_CARE_PROVIDER_SITE_OTHER): Payer: Medicare Other | Admitting: Neurology

## 2022-05-10 DIAGNOSIS — R0681 Apnea, not elsewhere classified: Secondary | ICD-10-CM

## 2022-05-10 DIAGNOSIS — G4731 Primary central sleep apnea: Secondary | ICD-10-CM

## 2022-05-10 DIAGNOSIS — R351 Nocturia: Secondary | ICD-10-CM

## 2022-05-10 DIAGNOSIS — G4733 Obstructive sleep apnea (adult) (pediatric): Secondary | ICD-10-CM

## 2022-05-10 DIAGNOSIS — R0683 Snoring: Secondary | ICD-10-CM | POA: Diagnosis not present

## 2022-05-10 DIAGNOSIS — G4719 Other hypersomnia: Secondary | ICD-10-CM

## 2022-05-10 DIAGNOSIS — R063 Periodic breathing: Secondary | ICD-10-CM

## 2022-05-10 DIAGNOSIS — E669 Obesity, unspecified: Secondary | ICD-10-CM

## 2022-05-10 DIAGNOSIS — G4720 Circadian rhythm sleep disorder, unspecified type: Secondary | ICD-10-CM

## 2022-05-24 ENCOUNTER — Telehealth: Payer: Self-pay

## 2022-05-24 NOTE — Telephone Encounter (Signed)
-----   Message from Star Age, MD sent at 05/24/2022  1:41 PM EDT ----- Patient referred by Dr. Virgina Jock, seen by me on 04/14/22, diagnostic PSG on 05/10/22.   Please call and notify the patient that the recent sleep study showed moderate sleep apnea, and I recommend he return for a second sleep study for proper titration and mask fitting and correct monitoring of the oxygen saturations. He may need a CPAP or a different type of machine called BiPAP. Please explain to patient. I have placed an order in the chart. Thanks.  Star Age, MD, PhD [facility])

## 2022-05-24 NOTE — Procedures (Signed)
PATIENT'S NAME:  Joel Moore, Joel Moore DOB:      03/16/1951      MR#:    [id number]     DATE OF RECORDING: 05/10/2022 REFERRING M.D.:  Shon Baton, MD Study Performed:   Baseline Polysomnogram HISTORY: 71 year old man with a history of reflux disease, hyperlipidemia, osteoarthritis, prediabetes, dizziness, Mnire's disease, and obesity, who reports snoring and excessive daytime somnolence. The patient endorsed the Epworth Sleepiness Scale at 13 points. The patient's weight 229 pounds with a height of 67 (inches), resulting in a BMI of 36. kg/m2. The patient's neck circumference measured 17 7/8 inches.  CURRENT MEDICATIONS: Proair, Aspirin, Lipitor, Alllegra-D, Glucosamine, Hydrodiuril, Multivitamin, Oxycodone   PROCEDURE:  This is a multichannel digital polysomnogram utilizing the Somnostar 11.2 system.  Electrodes and sensors were applied and monitored per AASM Specifications.   EEG, EOG, Chin and Limb EMG, were sampled at 200 Hz.  ECG, Snore and Nasal Pressure, Thermal Airflow, Respiratory Effort, CPAP Flow and Pressure, Oximetry was sampled at 50 Hz. Digital video and audio were recorded.      BASELINE STUDY  Lights Out was at 21:38 and Lights On at 05:00.  Total recording time (TRT) was 443 minutes, with a total sleep time (TST) of 251 minutes.   The patient's sleep latency was 27.5 minutes.  REM latency was 114.5 minutes, which is high normal. The sleep efficiency was 56.7%, which is reduced.     SLEEP ARCHITECTURE: WASO (Wake after sleep onset) was 164 minutes with several longer periods of wakefulness and moderate sleep fragmentation noted. There were 34 minutes in Stage N1, 168.5 minutes Stage N2, 6.5 minutes Stage N3 and 42 minutes in Stage REM.  The percentage of Stage N1 was 13.5%, which is increased, Stage N2 was 67.1%, which is increased. Stage N3 was 2.6% and Stage R (REM sleep) was 16.7%, which is mildly reduced. The arousals were noted as: 69 were spontaneous, 0 were associated with PLMs, 38  were associated with respiratory events.  RESPIRATORY ANALYSIS:  There were a total of 109 respiratory events:  40 obstructive apneas, 45 central apneas and 0 mixed apneas with a total of 85 apneas and an apnea index (AI) of 20.3 /hour. There were 24 hypopneas with a hypopnea index of 5.7 /hour. The patient also had 0 respiratory event related arousals (RERAs).      The total APNEA/HYPOPNEA INDEX (AHI) was 26.1/hour and the total RESPIRATORY DISTURBANCE INDEX was  26.1 /hour.  3 events occurred in REM sleep and 87 events in NREM. The REM AHI was  4.3 /hour, versus a non-REM AHI of 30.4. The patient spent 31 minutes of total sleep time in the supine position and 220 minutes in non-supine.. The supine AHI was 61.9 versus a non-supine AHI of 21.0.  OXYGEN SATURATION & C02:  The Wake baseline 02 saturation was 96%, with the lowest being 84%. Time spent below 89% saturation equaled 7 minutes.  PERIODIC LIMB MOVEMENTS: The patient had a total of 10 Periodic Limb Movements.  The Periodic Limb Movement (PLM) index was 2.4 and the PLM Arousal index was 0/hour.  Audio and video analysis did not show any abnormal or unusual movements, behaviors, phonations or vocalizations. The patient took 1 bathroom break. Mild to moderate snoring was noted. The EKG was in keeping with normal sinus rhythm (NSR).  Post-study, the patient indicated that sleep was the same as usual.   IMPRESSION: Obstructive Sleep Apnea (OSA) Central Sleep Apnea with periodic breathing Dysfunctions associated with sleep stages or  arousal from sleep  RECOMMENDATIONS: This study demonstrates moderate to severe sleep apnea with a primary obstructive component, but also central sleep apnea, with evidence of periodic breathing, possible Cheyne-Stokes, towards the end of the study, during supine non-REM sleep. The absence of supine REM sleep may underestimate his sleep disordered breathing. His total AHI was 26.1/hour, REM AHI was 4.3/hour, supine  AHI was 61.9/hour and O2 nadir of 84%. Treatment with positive airway pressure in the form of CPAP is recommended. He may require BiPAP or BiPAP ST, if CPAP does not fully correct his sleep disordered breathing, particularly, his central respiratory events. A full night titration study is recommended to optimize therapy settings, treatment modality, mask fit, monitoring of tolerance and of proper oxygen saturations. Other treatment options may be limited, especially given his mixed sleep apnea. Concomitant weight loss is recommended. Please note that untreated obstructive sleep apnea may carry additional perioperative morbidity. Patients with significant obstructive sleep apnea should receive perioperative PAP therapy and the surgeons and particularly the anesthesiologist should be informed of the diagnosis and the severity of the sleep disordered breathing. This study shows sleep fragmentation and abnormal sleep stage percentages; these are nonspecific findings and per se do not signify an intrinsic sleep disorder or a cause for the patient's sleep-related symptoms. Causes include (but are not limited to) the first night effect of the sleep study, circadian rhythm disturbances, medication effect or an underlying mood disorder or medical problem.  The patient should be cautioned not to drive, work at heights, or operate dangerous or heavy equipment when tired or sleepy. Review and reiteration of good sleep hygiene measures should be pursued with any patient. The patient will be seen in follow-up in the sleep clinic at [facility] for discussion of the test results, symptom and treatment compliance review, further management strategies, etc. The patient and his referring provider will be notified of the test results.  I certify that I have reviewed the entire raw data recording prior to the issuance of this report in accordance with the Standards of Accreditation of the American Academy of Sleep Medicine (AASM)  Star Age, MD, PhD Diplomat, American Board of Neurology and Sleep Medicine (Neurology and Sleep Medicine)

## 2022-05-24 NOTE — Telephone Encounter (Signed)
I called patient to discuss his sleep study results. No answer, left a message asking him to call us back. If patient calls back another day please route to POD 4.

## 2022-05-24 NOTE — Addendum Note (Signed)
Addended by: Star Age on: 05/24/2022 01:42 PM   Modules accepted: Orders

## 2022-05-30 NOTE — Telephone Encounter (Signed)
LVM for pt to call back to schedule his sleep study.

## 2022-05-30 NOTE — Telephone Encounter (Signed)
Spoke with pt and his wife Joel Moore (on [place]) and discussed. He is amenable to proceed with a second sleep study for proper titration, mask fitting, oxygen saturation monitoring. Pt may need a CPAP or different type of machine call a BiPAP. He and his wife agree to move forward with second sleep study and will await a call from our office to schedule.

## 2022-06-01 NOTE — Telephone Encounter (Signed)
x2 LVM for pt to call back to schedule  

## 2022-06-20 ENCOUNTER — Ambulatory Visit (INDEPENDENT_AMBULATORY_CARE_PROVIDER_SITE_OTHER): Payer: Medicare Other | Admitting: Neurology

## 2022-06-20 DIAGNOSIS — R351 Nocturia: Secondary | ICD-10-CM

## 2022-06-20 DIAGNOSIS — G4733 Obstructive sleep apnea (adult) (pediatric): Secondary | ICD-10-CM

## 2022-06-20 DIAGNOSIS — Z9989 Dependence on other enabling machines and devices: Secondary | ICD-10-CM

## 2022-06-20 DIAGNOSIS — E669 Obesity, unspecified: Secondary | ICD-10-CM

## 2022-06-20 DIAGNOSIS — G4719 Other hypersomnia: Secondary | ICD-10-CM

## 2022-06-20 DIAGNOSIS — G4731 Primary central sleep apnea: Secondary | ICD-10-CM

## 2022-06-20 DIAGNOSIS — R063 Periodic breathing: Secondary | ICD-10-CM

## 2022-06-20 DIAGNOSIS — G4720 Circadian rhythm sleep disorder, unspecified type: Secondary | ICD-10-CM

## 2022-06-28 NOTE — Addendum Note (Signed)
Addended by: Star Age on: 06/28/2022 05:46 PM   Modules accepted: Orders

## 2022-06-28 NOTE — Procedures (Signed)
PATIENT'S NAME:  Sumner, Kirchman DOB:      July 08, 1951      MR#:    [id number]     DATE OF RECORDING: 06/20/2022 REFERRING M.D.:  Shon Baton, MD Study Performed:   CPAP  Titration HISTORY: 71 year old man with a history of reflux disease, hyperlipidemia, osteoarthritis, prediabetes, dizziness, Mnire's disease, and obesity, who presents for a full night titration study to treat his OSA. His baseline sleep study from 05/10/22 showed moderate to severe sleep apnea with a primary obstructive component, but also central sleep apnea, with evidence of periodic breathing, possible Cheyne-Stokes, towards the end of the study, during supine non-REM sleep. The absence of supine REM sleep may have underestimated his sleep disordered breathing. His total AHI was 26.1/hour, REM AHI was 4.3/hour, supine AHI was 61.9/hour and O2 nadir of 84%. The patient endorsed the Epworth Sleepiness Scale at 13 points and the Fatigue Score at  points. The patient's weight 229 pounds with a height of 67 (inches), resulting in a BMI of 36. kg/m2. The patient's neck circumference measured 17 7/8 inches.  CURRENT MEDICATIONS: Proair, Aspirin, Lipitor, Alllegra-D, Glucosamine, Hydrodiuril, Multivitamin, Oxycodone   PROCEDURE:  This is a multichannel digital polysomnogram utilizing the SomnoStar 11.2 system.  Electrodes and sensors were applied and monitored per AASM Specifications.   EEG, EOG, Chin and Limb EMG, were sampled at 200 Hz.  ECG, Snore and Nasal Pressure, Thermal Airflow, Respiratory Effort, CPAP Flow and Pressure, Oximetry was sampled at 50 Hz. Digital video and audio were recorded.      The patient was fitted with a sm/med [place] FFM from [employer]. CPAP was initiated at 5 cm with heated humidity per AASM standards and pressure was advanced to 10 cmH20 because of hypopneas, apneas and desaturations. He had primary central respiratory events throughout the night, and an adequate CPAP setting was not found. He will need BiPAP or BiPAP  ST. Final AHI was 37.6/hour on CPAP of 10 cm, due to central events only. On a pressure of 9 cm and 10 cm, there was evidence of periodic breathing.   Lights Out was at 21:57 and Lights On at 05:00. Total recording time (TRT) was 424 minutes, with a total sleep time (TST) of 257.5 minutes. The patient's sleep latency was 39.5 minutes. REM latency was 42.5 minutes, which is reduced. The sleep efficiency was 60.7%, which is reduced.    SLEEP ARCHITECTURE: WASO (Wake after sleep onset)  was 135.5 minutes with moderate sleep fragmentation noted. There were 23.5 minutes in Stage N1, 148.5 minutes Stage N2, 30 minutes Stage N3 and 55.5 minutes in Stage REM.  The percentage of Stage N1 was 9.1%, which is increased, Stage N2 was 57.7%, which is mildly increased, Stage N3 was 11.7% and Stage R (REM sleep) was 21.6%, which is normal. The arousals were noted as: 120 were spontaneous, 0 were associated with PLMs, 9 were associated with respiratory events.  RESPIRATORY ANALYSIS:  There was a total of 39 respiratory events: 0 obstructive apneas, 33 central apneas and 0 mixed apneas with a total of 33 apneas and an apnea index (AI) of 7.7 /hour. There were 6 hypopneas with a hypopnea index of 1.4/hour. The patient also had 0 respiratory event related arousals (RERAs).      The total APNEA/HYPOPNEA INDEX  (AHI) was 9.1 /hour and the total RESPIRATORY DISTURBANCE INDEX was 9.1 /hour  3 events occurred in REM sleep and 36 events in NREM. The REM AHI was 3.2 /hour versus a non-REM  AHI of 10.7 /hour.  The patient spent 5.5 minutes of total sleep time in the supine position and 252 minutes in non-supine. The supine AHI was 10.9, versus a non-supine AHI of 9.0.  OXYGEN SATURATION & C02:  The baseline 02 saturation was 96%, with the lowest being 90%. Time spent below 89% saturation equaled 0 minutes.  PERIODIC LIMB MOVEMENTS:  The patient had a total of 61 Periodic Limb Movements. The Periodic Limb Movement (PLM) index was  14.2 and the PLM Arousal index was 0 /hour.   Audio and video analysis did not show any abnormal or unusual movements, behaviors, phonations or vocalizations. The patient took 1 bathroom break. Snoring was reduced. The EKG was in keeping with normal sinus rhythm (NSR).  Post-study, the patient indicated that sleep was the same as usual.   Primary Central Sleep Apnea with periodic breathing Obstructive Sleep Apnea (OSA) Dysfunctions associated with sleep stages or arousal from sleep   RECOMMENDATIONS: This study demonstrates primary central sleep apnea and no resolution of the patient's sleep disordered breathing with standard CPAP therapy. Given his central sleep apnea, I will prescribe home autoBiPAP therapy for now. He may benefit from a designated bilevel titration study in the future, should autoBiPAP not be fully successful. The patient should be reminded to be fully compliant with PAP therapy to improve sleep related symptoms and decrease long term cardiovascular risks. The patient should be reminded, that it may take up to 3 months to get fully used to using PAP with all planned sleep. The earlier full compliance is achieved, the better long term compliance tends to be.. This study shows sleep fragmentation and abnormal sleep stage percentages; these are nonspecific findings and per se do not signify an intrinsic sleep disorder or a cause for the patient's sleep-related symptoms. Causes include (but are not limited to) the first night effect of the sleep study, circadian rhythm disturbances, medication effect or an underlying mood disorder or medical problem.  The patient should be cautioned not to drive, work at heights, or operate dangerous or heavy equipment when tired or sleepy. Review and reiteration of good sleep hygiene measures should be pursued with any patient. The patient will be seen in follow-up in the sleep clinic at [facility] for discussion of the test results, symptom and treatment  compliance review, further management strategies, etc. The patient and his referring provider will be notified of the test results.   I certify that I have reviewed the entire raw data recording prior to the issuance of this report in accordance with the Standards of Accreditation of the American Academy of Sleep Medicine (AASM)   Star Age, MD, PhD Diplomat, American Board of Neurology and Sleep Medicine (Neurology and Sleep Medicine)

## 2022-07-04 ENCOUNTER — Encounter: Payer: Self-pay | Admitting: *Deleted

## 2022-07-04 ENCOUNTER — Telehealth: Payer: Self-pay

## 2022-07-04 NOTE — Telephone Encounter (Signed)
I called pt. No answer, left a message asking pt to call me back.   

## 2022-07-04 NOTE — Telephone Encounter (Signed)
-----   Message from Star Age, MD sent at 06/28/2022  5:46 PM EDT ----- Patient had a CPAP titration study on 06/20/22.  Please call and inform patient that I have entered an order for treatment with positive airway pressure (PAP) treatment for obstructive sleep apnea (OSA). He did not do well enough on CPAP therapy during the study. I will write for a machine called autoBiPAP. We will arrange for a machine for home use through a DME (durable medical equipment) company of His choice; and I will see the patient back in follow-up in about 10 weeks. Please also explain to the patient that I will be looking out for compliance data, which can be downloaded from the machine (stored on an SD card, that is inserted in the machine) or via remote access through a modem, that is built into the machine. At the time of the followup appointment we will discuss sleep study results and how it is going with PAP treatment at home. Please advise patient to bring His machine at the time of the first FU visit, even though this is cumbersome. Bringing the machine for every visit after that will likely not be needed, but often helps for the first visit to troubleshoot if needed. Please re-enforce the importance of compliance with treatment and the need for [place] to monitor compliance data - often an insurance requirement and actually good feedback for the patient as far as how they are doing.  Also remind patient, that any interim PAP machine or mask issues should be first addressed with the DME company, as they can often help better with technical and mask fit issues. Please ask if patient has a preference regarding DME company.  Please also make sure, the patient has a follow-up appointment with me in about 10 weeks from the setup date, thanks. May see one of our nurse practitioners if needed for proper timing of the FU appointment.  Please fax or rout report to the referring provider. Thanks,   Star Age, MD, PhD [facility])

## 2022-07-04 NOTE — Telephone Encounter (Signed)
-----   Message from Star Age, MD sent at 06/28/2022  5:46 PM EDT ----- Patient had a CPAP titration study on 06/20/22.  Please call and inform patient that I have entered an order for treatment with positive airway pressure (PAP) treatment for obstructive sleep apnea (OSA). He did not do well enough on CPAP therapy during the study. I will write for a machine called autoBiPAP. We will arrange for a machine for home use through a DME (durable medical equipment) company of His choice; and I will see the patient back in follow-up in about 10 weeks. Please also explain to the patient that I will be looking out for compliance data, which can be downloaded from the machine (stored on an SD card, that is inserted in the machine) or via remote access through a modem, that is built into the machine. At the time of the followup appointment we will discuss sleep study results and how it is going with PAP treatment at home. Please advise patient to bring His machine at the time of the first FU visit, even though this is cumbersome. Bringing the machine for every visit after that will likely not be needed, but often helps for the first visit to troubleshoot if needed. Please re-enforce the importance of compliance with treatment and the need for [place] to monitor compliance data - often an insurance requirement and actually good feedback for the patient as far as how they are doing.  Also remind patient, that any interim PAP machine or mask issues should be first addressed with the DME company, as they can often help better with technical and mask fit issues. Please ask if patient has a preference regarding DME company.  Please also make sure, the patient has a follow-up appointment with me in about 10 weeks from the setup date, thanks. May see one of our nurse practitioners if needed for proper timing of the FU appointment.  Please fax or rout report to the referring provider. Thanks,   Star Age, MD, PhD [facility])

## 2022-07-04 NOTE — Telephone Encounter (Addendum)
Spoke with patient  gave sleep study results . Pt states he was about to go vacation but wanted  to know his sleep study results Pt chose [facility] for DME  Pt made a follow up appointment for 08/2022 Pt is aware he has to stay complaint in order for insurance to pay for BIPAP and supplies . Pt is aware to wear BIPAP 4+ hours nightly and keep all his follow up appointments . Pt states if he has anymore questions or concerns he will call us back. Pt thanked me for speaking with him . Per Dr Rexene Alberts will forward sleep study results to referring provider.  Will fax orders to [facility] this  afternoon

## 2022-07-04 NOTE — Telephone Encounter (Signed)
Error

## 2022-07-05 NOTE — Telephone Encounter (Signed)
FYI Had to change DME to [facility] due to patients insurance will mychart patient this am to make him aware.

## 2022-09-05 ENCOUNTER — Telehealth: Payer: Self-pay | Admitting: Neurology

## 2022-09-05 NOTE — Telephone Encounter (Signed)
inital CPAP follow up has been made. Pt aware to bring CPAP machine and powercord to initial visit; Aircurve 10 VAUTO setup 09/01/22 (appt needed 10/02/22 - 11/30/2022)

## 2022-09-15 ENCOUNTER — Encounter: Payer: Medicare Other | Admitting: Neurology

## 2022-10-05 ENCOUNTER — Telehealth: Payer: Self-pay | Admitting: *Deleted

## 2022-10-05 NOTE — Telephone Encounter (Signed)
Joel Moore received his machine on 09/01/22. Since this is a Bipap machine, he will need 61-90 days of data per insurance requirement for his initial follow-up visit. He was scheduled for tomorrow but this will be too soon of an appointment. Please call patient today and reschedule him for an appointment between 11/02/2022 and 12/01/2022.

## 2022-10-05 NOTE — Telephone Encounter (Signed)
Pt scheduled for follow up visit with Dr. Rexene Alberts on 11/08/22 at 7:45am

## 2022-10-06 ENCOUNTER — Ambulatory Visit: Payer: Medicare Other | Admitting: Neurology

## 2022-11-08 ENCOUNTER — Ambulatory Visit: Payer: Medicare Other | Admitting: Neurology

## 2022-11-08 ENCOUNTER — Encounter: Payer: Self-pay | Admitting: Neurology

## 2022-11-08 VITALS — BP 144/84 | HR 52 | Ht 67.0 in | Wt 227.0 lb

## 2022-11-08 DIAGNOSIS — G4731 Primary central sleep apnea: Secondary | ICD-10-CM

## 2022-11-08 DIAGNOSIS — G4733 Obstructive sleep apnea (adult) (pediatric): Secondary | ICD-10-CM | POA: Diagnosis not present

## 2022-11-08 NOTE — Progress Notes (Signed)
Subjective:    Patient ID: Joel Moore is a 71 y.o. male.  HPI    Interim history:   Joel Moore is a 71 year old right-handed gentleman with an underlying medical history of reflux disease, hyperlipidemia, osteoarthritis, prediabetes, dizziness (for which she saw Joel Moore in May 2022), Mnire's disease, and obesity, who Who presents for follow-up consultation of his obstructive sleep apnea after interim testing and starting home BiPAP therapy.  The patient is unaccompanied today.  I first met him at the request of his primary care physician on 04/14/2022, at which time he reported snoring and excessive daytime somnolence.  He was advised to proceed with a sleep study.  He had a baseline sleep study followed by a PAP titration study.  His baseline sleep study from 05/10/2022 showed a total AHI of 26.1/h, O2 nadir 84%.  He had also evidence of central apneas with evidence of periodic breathing towards the end of the study.  For this reason he was advised to return for a formal titration study.  He had a CPAP titration on 06/20/2022.  He was fitted with a fullface mask from [employer].  He was initiated on CPAP at 5 cm and advanced to 10 cm.  He had primarily central respiratory events throughout the study and an adequate CPAP setting was not found.  His AHI on a CPAP of 10 cm was 37.6/h.  He also had evidence of periodic breathing towards the end of the study.  Sleep latency was reduced, REM sleep percentage was normal, he had moderate sleep fragmentation.  He was advised to proceed with home AutoBiPAP therapy.  His set up date was 09/01/2022.  He has a [employer] air curve 10 machine.  Today, 11/08/2022: I reviewed his auto BiPAP compliance data from 10/08/2022 through 11/06/2022, which is a total of 30 days, during which time he used his machine every night with percent use days greater than 4 hours at 100%, indicating superb compliance, average usage of 7 hours and 13 minutes, residual AHI mildly elevated  at 8.3/h (mostly due to central events), leak acceptable with the 95th percentile at 7.9 L/min.  95th percentile of IPAP at 12.8 cm, 95th percentile of EPAP at 8.8 cm, pressure support of 4, maximum IPAP setting of 16, minimum EPAP setting of 4 cm.  He reports having adjusted quite well to his BiPAP, he uses an under the nose style fullface mask, he feels that his sleep is more consolidated and more restful even though he still has sleep disruption.  He is motivated to continue with treatment, his wife has given him positive feedback regarding decreased snoring as well. His Epworth sleepiness score is 5 out of 24, of note, it was 13 before.  He does not use the humidifier, he tried the lowest setting and still pooling water in the tube.  The patient's allergies, current medications, family history, past medical history, past social history, past surgical history and problem list were reviewed and updated as appropriate.   Previously:   04/14/22: (He) reports snoring and excessive daytime somnolence.  His Epworth sleepiness score is 13 out of 24, fatigue severity score is 18 out of 63.  He has had witnessed apneas per wife's report.  He lives with his wife.  He is retired.  He denies morning headaches but has nocturia about once or twice per average night.  He goes to bed around 11 PM and rise time is between 7 AM and 7:30 AM.  He works for the  [employer].  They have 2 grown children, no pets in the household.  He does not have a TV in the bedroom.  He has a half brother with sleep apnea.  He quit smoking in 1974, he drinks alcohol about 3-4 times a week, no daily caffeine.  He is working on weight loss.    His Past Medical History Is Significant For: Past Medical History:  Diagnosis Date   GERD (gastroesophageal reflux disease)    High cholesterol    Meniere disease    OA (osteoarthritis)    fingers and L knee, L5-S1 DDD   Pre-diabetes    Umbilical hernia     His Past Surgical History  Is Significant For: Past Surgical History:  Procedure Laterality Date   CATARACT EXTRACTION     Dr. Talbert Forest   INSERTION OF MESH N/A 06/16/2020   Procedure: INSERTION OF MESH;  Surgeon: Coralie Keens, MD;  Location: [facility];  Service: General;  Laterality: N/A;   KNEE ARTHROSCOPY  1980's   KNEE SURGERY Left    UMBILICAL HERNIA REPAIR N/A 06/16/2020   Procedure: UMBILICAL HERNIA REPAIR WITH MESH;  Surgeon: Coralie Keens, MD;  Location: [facility];  Service: General;  Laterality: N/A;  LMA    His Family History Is Significant For: Family History  Problem Relation Age of Onset   Diabetes Mother    Dementia Father    Cancer Father    Sleep apnea Brother     His Social History Is Significant For: Social History   Socioeconomic History   Marital status: Married    Spouse name: Not on file   Number of children: Not on file   Years of education: Not on file   Highest education level: Not on file  Occupational History   Not on file  Tobacco Use   Smoking status: Never   Smokeless tobacco: Never  Vaping Use   Vaping Use: Never used  Substance and Sexual Activity   Alcohol use: Yes    Alcohol/week: 4.0 standard drinks of alcohol    Types: 4 Cans of beer per week   Drug use: Never   Sexual activity: Not on file  Other Topics Concern   Not on file  Social History Narrative   Right handed   One story home   Occasionally caffeine   Social Determinants of Health   Financial Resource Strain: Not on file  Food Insecurity: Not on file  Transportation Needs: Not on file  Physical Activity: Not on file  Stress: Not on file  Social Connections: Not on file    His Allergies Are:  No Known Allergies:   His Current Medications Are:  Outpatient Encounter Medications as of 11/08/2022  Medication Sig   aspirin 81 MG EC tablet Take by mouth.   atorvastatin (LIPITOR) 20 MG tablet    Fexofenadine-Pseudoephedrine (ALLEGRA-D 12 HOUR PO)     glucosamine-chondroitin 500-400 MG tablet Take 1 tablet by mouth 3 (three) times daily.   hydrochlorothiazide (HYDRODIURIL) 25 MG tablet 1 tablet in the morning   Multiple Vitamin (MULTIVITAMIN) capsule Take 1 capsule by mouth daily.   Albuterol Sulfate (PROAIR RESPICLICK) 161 (90 Base) MCG/ACT AEPB 1 puff as needed   oxyCODONE (OXY IR/ROXICODONE) 5 MG immediate release tablet Take 1 tablet (5 mg total) by mouth every 6 (six) hours as needed for moderate pain or severe pain.   No facility-administered encounter medications on file as of 11/08/2022.  :  Review of Systems:  Out of a complete 14 point review of systems, all are reviewed and negative with the exception of these symptoms as listed below:  Review of Systems  Neurological:        Pt here for CPAP f/u Pt states no questions or concerns for today's visit    ESS:5    Objective:  Neurological Exam  Physical Exam Physical Examination:   Vitals:   11/08/22 0730  BP: (!) 144/84  Pulse: (!) 52    General Examination: The patient is a very pleasant 71 y.o. male in no acute distress. He appears well-developed and well-nourished and well groomed.   HEENT: Normocephalic, atraumatic, pupils are equal, round and reactive to light, extraocular tracking well-preserved, face is symmetric with normal facial animation.  Hearing grossly intact, speech is clear without dysarthria, hypophonia or voice tremor.  Neck with full range of motion, oropharynx exam, stable findings, moderate airway crowding, tongue protrudes centrally and palate elevates symmetrically.     Chest: Clear to auscultation without wheezing, rhonchi or crackles noted.   Heart: S1+S2+0, regular and normal without murmurs, rubs or gallops noted.    Abdomen: Soft, non-tender and non-distended.   Extremities: There is no obvious edema in the distal lower extremities bilaterally.    Skin: Warm and dry without trophic changes noted.    Musculoskeletal: exam reveals no  obvious joint deformities.    Neurologically:  Mental status: The patient is awake, alert and oriented in all 4 spheres. His immediate and remote memory, attention, language skills and fund of knowledge are appropriate. There is no evidence of aphasia, agnosia, apraxia or anomia. Speech is clear with normal prosody and enunciation. Thought process is linear. Mood is normal and affect is normal.  Cranial nerves II - XII are as described above under HEENT exam.  Motor exam: Normal bulk, strength and tone is noted. There is no obvious tremor.  Fine motor skills and coordination: grossly intact.  Cerebellar testing: No dysmetria or intention tremor. There is no truncal or gait ataxia.  Sensory exam: intact to light touch in the upper and lower extremities.  Gait, station and balance: He stands easily. No veering to one side is noted. No leaning to one side is noted. Posture is age-appropriate and stance is narrow based. Gait shows normal stride length and normal pace. No problems turning are noted.    Assessment and Plan:  In summary, Haylen Shelnutt Kitchings is a very pleasant 71 year old male with an underlying medical history of reflux disease, hyperlipidemia, osteoarthritis, prediabetes, dizziness (for which she saw Joel Moore in May 2022), Mnire's disease, and obesity, who presents for follow-up consultation of his mixed sleep apnea with a primary obstructive component.  His baseline sleep study in May 2023 showed moderate obstructive sleep apnea with a central component.  He had a titration study in July 2023, CPAP therapy did not resolve his next sleep apnea.  He was therefore placed on auto BiPAP therapy at home.  He has established treatment since September 2023, he is fully compliant with treatment with decent apnea control, mildly elevated residual AHI at 8.3/h for the current last month.  He is advised to continue with the current settings, I do not believe we need to change his pressure settings.  He has  adjusted to the fullface mask and is very motivated to continue with treatment, he is benefiting from treatment as well thankfully.  He is advised at this juncture to follow-up routinely in 1 year to see one of  our nurse practitioners.  I answered all his questions today and he was in agreement.   I spent 30 minutes in total face-to-face time and in reviewing records during pre-charting, more than 50% of which was spent in counseling and coordination of care, reviewing test results, reviewing medications and treatment regimen and/or in discussing or reviewing the diagnosis of OSA, CSA, the prognosis and treatment options. Pertinent laboratory and imaging test results that were available during this visit with the patient were reviewed by me and considered in my medical decision making (see chart for details).

## 2022-11-08 NOTE — Patient Instructions (Signed)
It was nice to see you again today.  I am glad to hear that your BiPAP machine is working well for you.  You are compliant with treatment, keep up the good work!  Your sleep apnea scores are very mildly elevated at this time, I think we can leave your settings as is for now, and I would like for you to follow-up in sleep clinic to see one of our nurse practitioners in 1 year routinely.  Please continue using your BiPAP regularly. While your insurance requires that you use BiPAP at least 4 hours each night on 70% of the nights, I recommend, that you not skip any nights and use it throughout the night if you can. Getting used to BiPAP and staying with the treatment long term does take time and patience and discipline. Untreated obstructive sleep apnea when it is moderate to severe can have an adverse impact on cardiovascular health and raise her risk for heart disease, arrhythmias, hypertension, congestive heart failure, stroke and diabetes. Untreated obstructive sleep apnea causes sleep disruption, nonrestorative sleep, and sleep deprivation. This can have an impact on your day to day functioning and cause daytime sleepiness and impairment of cognitive function, memory loss, mood disturbance, and problems focussing. Using BiPAP regularly can improve these symptoms.

## 2023-07-05 IMAGING — CT CT CARDIAC CORONARY ARTERY CALCIUM SCORE
3 series · 14 of 20 positions shown, 16 images · non-contrast
Comparison: None.

CLINICAL DATA: 70-year-old Caucasian male with history of
hyperlipidemia and prior smoking history.

EXAM:
CT CARDIAC CORONARY ARTERY CALCIUM SCORE
TECHNIQUE: Non-contrast imaging through the heart was performed using
prospective ECG gating. Image post processing was performed on an
independent workstation, allowing for quantitative analysis of the
heart and coronary arteries. Note that this exam targets the heart
and the chest was not imaged in its entirety.

[Series 2: calcium scoring 2.00 qr36 bestdiast 69% hrt calciu · axial · 0.45mm/px · z∈[+1650,+1734]mm · 4 of 70 slices shown]
[im 14/70  vessel]
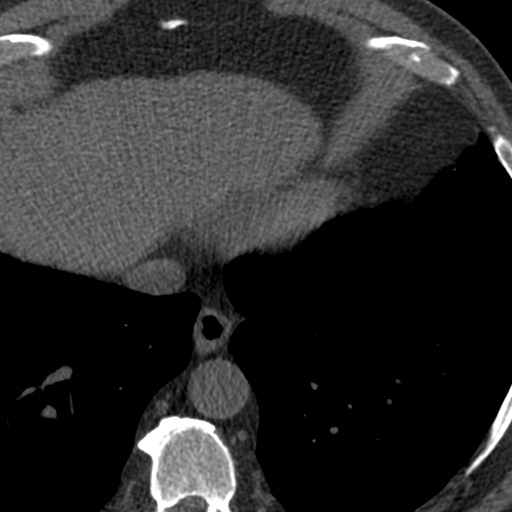
[im 28/70  vessel]
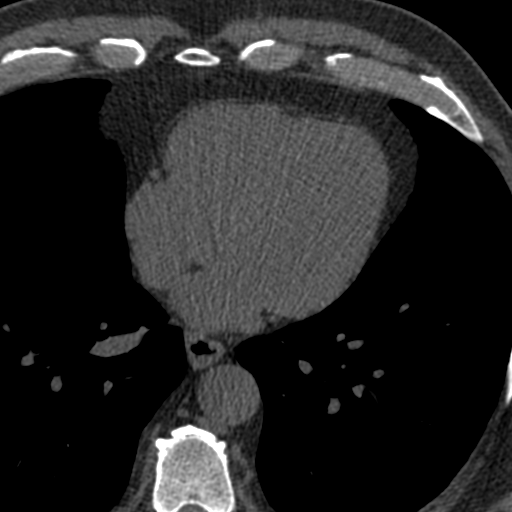
[im 42/70  vessel]
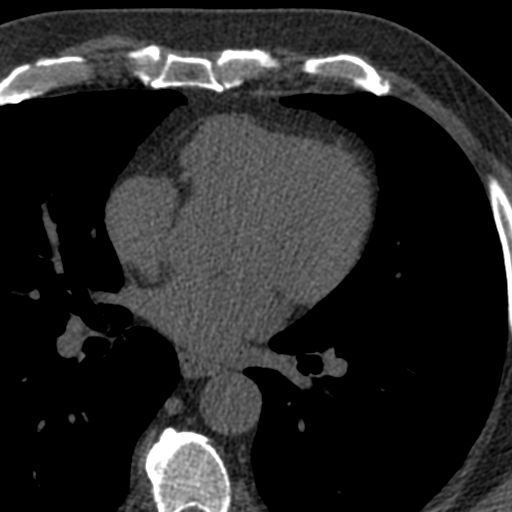
[im 56/70  vessel]
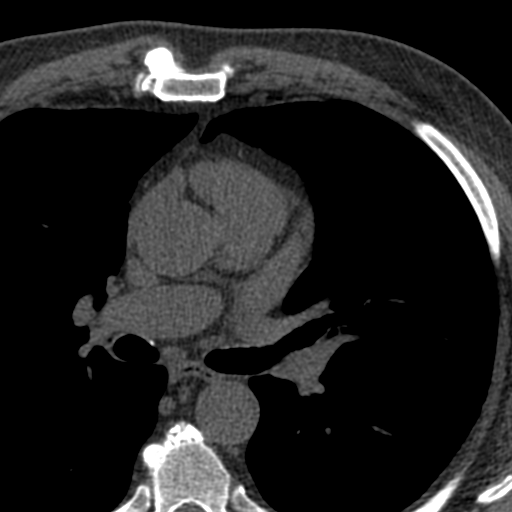

[Series 3: calcium scoring 2.00 br40 bestdiast 69% axial · axial · 0.75mm/px · z∈[+1646,+1738]mm · 5 of 70 slices shown, 7 images]
[im 12/70  vessel]
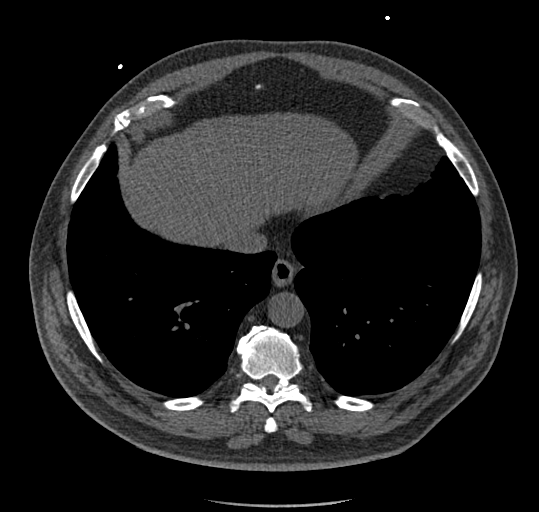
[im 12/70  lung]
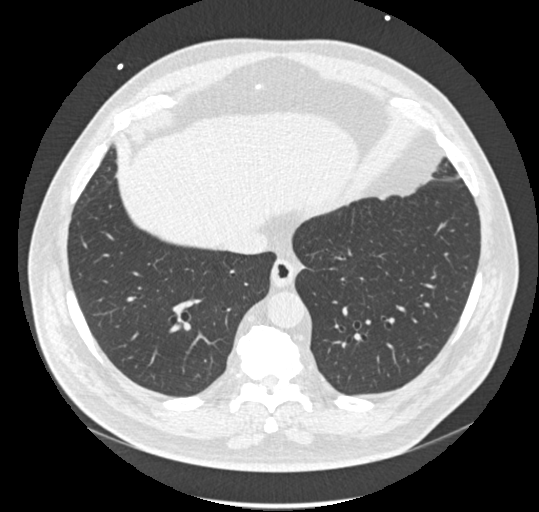
[im 24/70  vessel]
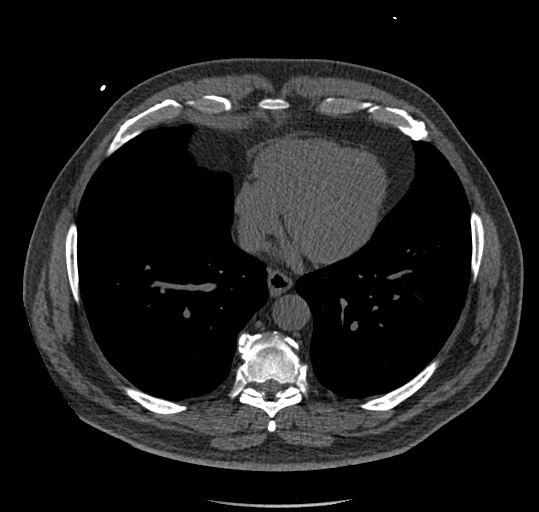
[im 35/70  vessel]
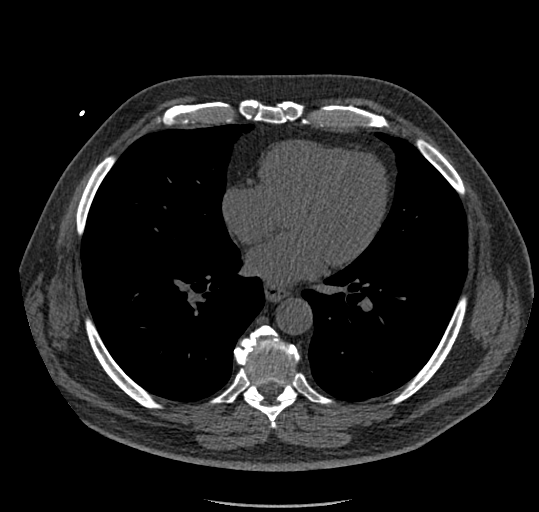
[im 47/70  vessel]
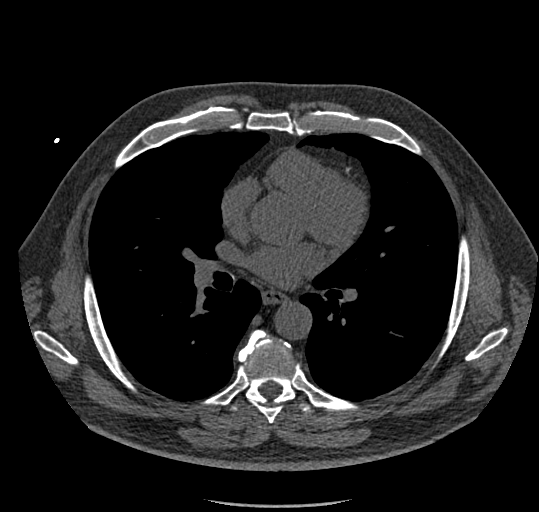
[im 58/70  vessel]
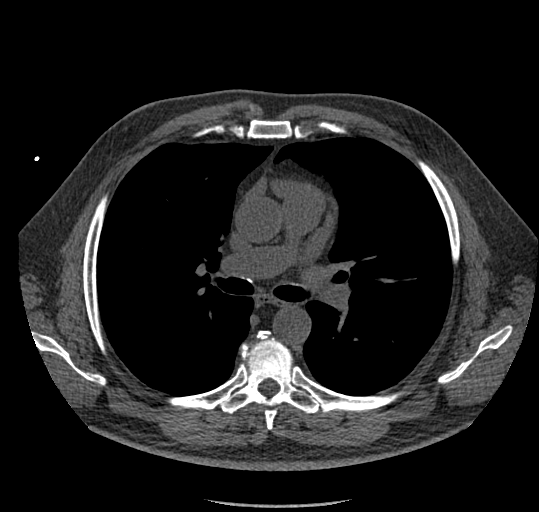
[im 58/70  lung]
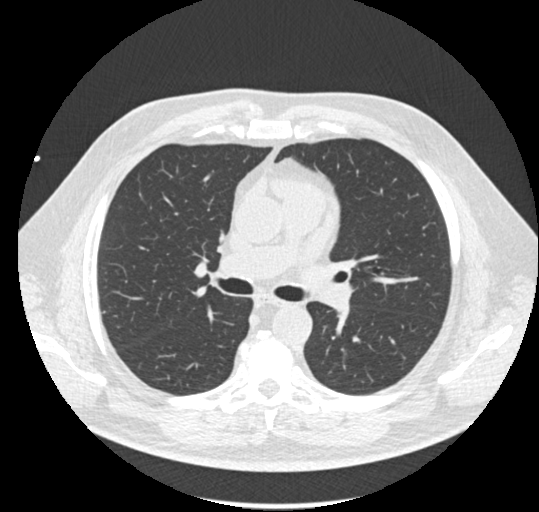

[Series 9: calcium scoring 2.00 br60 bestdiast 69% lungs · axial · 0.75mm/px · z∈[+1646,+1738]mm · 5 of 70 slices shown]
[im 12/70  vessel]
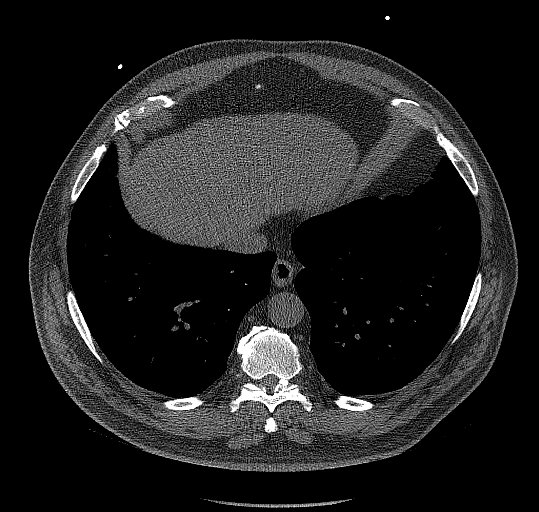
[im 24/70  vessel]
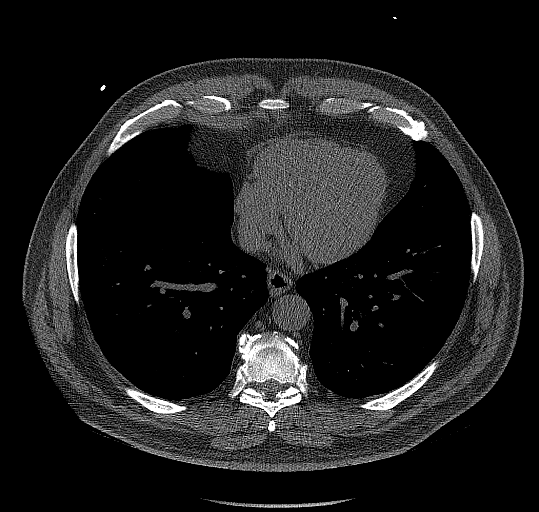
[im 35/70  vessel]
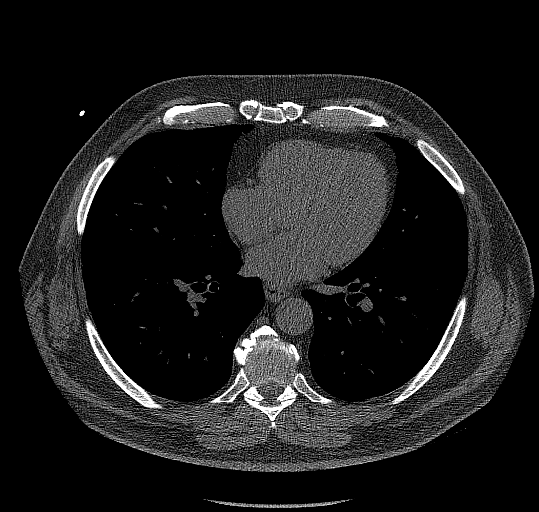
[im 47/70  vessel]
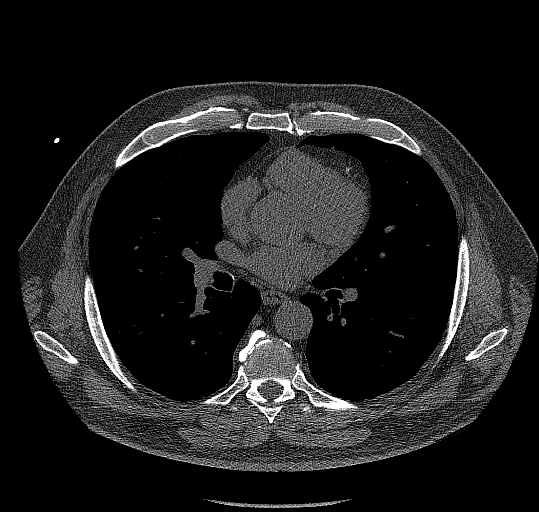
[im 58/70  vessel]
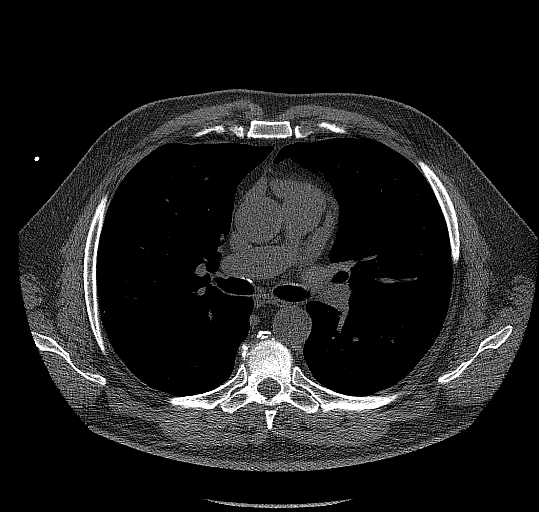

[14 of 20 positions shown; findings below may reference images not displayed]

FINDINGS: CORONARY CALCIUM SCORES:

Left Main: 0

LAD: 76

LCx: 18

RCA: 11

Total Agatston Score: 105

[HOSPITAL] percentile: 48

AORTA MEASUREMENTS:

Ascending Aorta: 33 mm

Descending Aorta: 27 mm

OTHER FINDINGS:

The heart size is within normal limits. No pericardial fluid is
identified. Visualized segments of the thoracic aorta and central
pulmonary arteries are normal in caliber. Visualized mediastinum and
hilar regions demonstrate no lymphadenopathy or masses. Visualized
lungs show no evidence of pulmonary edema, consolidation,
pneumothorax, nodule or pleural fluid. Visualized upper abdomen and
bony structures are unremarkable.
IMPRESSION: Coronary calcium score of 105 is at the 48th percentile for the
patient's age, sex and race.

## 2023-11-09 NOTE — Progress Notes (Signed)
[facility] [address] [place]. [place] [address] [phone]       OFFICE FOLLOW UP NOTE  [name] Date of Birth:  [birth year] Medical Record Number:  [id number]    Primary neurologist: Dr. Frances Furbish Reason for visit: CPAP follow-up  Virtual Visit via Video Note  I connected with Joel Moore on 11/13/23 at  7:45 AM EST by a video enabled telemedicine application and verified that I am speaking with the correct person using two identifiers.  Location: Patient: at home Provider: in office   I discussed the limitations of evaluation and management by telemedicine and the availability of in person appointments. The patient expressed understanding and agreed to proceed.    SUBJECTIVE:    Follow-up visit:  Prior visit: 11/08/2022 with Dr. Frances Furbish  Brief HPI:   Joel Moore is a 72 y.o. male who was initially evaluated by Dr. Frances Furbish on 04/14/2022 for concern of underlying sleep apnea with reports of snoring, witnessed apneas and excessive daytime somnolence.  ESS 13/24. FSS 18/63.  Sleep study 05/10/2022 showed moderate to severe sleep apnea with total AHI 26.1/h, supine AHI 61.9/h with primarily obstructive component but also central component with evidence of periodic breathing, and possible Cheyne-Stokes.  Proceeded with titration study on 7/3, study demonstrated primary central sleep apnea and no resolution of apnea with standard CPAP therefore prescribed auto BiPAP.  Set up date 09/01/2022.  At prior visit with Dr. Frances Furbish, noted excellent compliance although mildly elevated AHI 8.3 (mostly due to central events), advised to continue with current settings.     Interval history:  Returns for yearly follow-up via MyChart video visit.  CPAP compliance report shows excellent compliance with residual AHI 6.5.  Continues to tolerate CPAP well. Sleeps well at night, mostly feels well rested during the day but at times can have some fatigue. On average per  CPAP report, sleeps 6 hrs and 49 minutes, reports sometimes gets less sleep and sometimes more, depends on his bedtime. Up to date on supplies, followed by DME [employer] [facility].  No questions or concerns at this time.        ROS:   14 system review of systems performed and negative with exception of those listed in HPI  PMH:  Past Medical History:  Diagnosis Date   GERD (gastroesophageal reflux disease)    High cholesterol    Meniere disease    OA (osteoarthritis)    fingers and L knee, L5-S1 DDD   Pre-diabetes    Umbilical hernia     PSH:  Past Surgical History:  Procedure Laterality Date   CATARACT EXTRACTION     Dr. Vonna Kotyk   INSERTION OF MESH N/A 06/16/2020   Procedure: INSERTION OF MESH;  Surgeon: Abigail Miyamoto, MD;  Location: [facility];  Service: General;  Laterality: N/A;   KNEE ARTHROSCOPY  1980's   KNEE SURGERY Left    UMBILICAL HERNIA REPAIR N/A 06/16/2020   Procedure: UMBILICAL HERNIA REPAIR WITH MESH;  Surgeon: Abigail Miyamoto, MD;  Location: [facility];  Service: General;  Laterality: N/A;  LMA    Social History:  Social History   Socioeconomic History   Marital status: Married    Spouse name: Not on file   Number of children: Not on file   Years of education: Not on file   Highest education level: Not on file  Occupational History   Not on file  Tobacco Use   Smoking status: Never  Smokeless tobacco: Never  Vaping Use   Vaping status: Never Used  Substance and Sexual Activity   Alcohol use: Yes    Alcohol/week: 4.0 standard drinks of alcohol    Types: 4 Cans of beer per week   Drug use: Never   Sexual activity: Not on file  Other Topics Concern   Not on file  Social History Narrative   Right handed   One story home   Occasionally caffeine   Social Determinants of Health   Financial Resource Strain: Not on file  Food Insecurity: Not on file  Transportation Needs: Not on file  Physical Activity: Not on  file  Stress: Not on file  Social Connections: Not on file  Intimate Partner Violence: Not on file    Family History:  Family History  Problem Relation Age of Onset   Diabetes Mother    Dementia Father    Cancer Father    Sleep apnea Brother     Medications:   Current Outpatient Medications on File Prior to Visit  Medication Sig Dispense Refill   Albuterol Sulfate (PROAIR RESPICLICK) 108 (90 Base) MCG/ACT AEPB 1 puff as needed     aspirin 81 MG EC tablet Take by mouth.     atorvastatin (LIPITOR) 20 MG tablet      Fexofenadine-Pseudoephedrine (ALLEGRA-D 12 HOUR PO)      glucosamine-chondroitin 500-400 MG tablet Take 1 tablet by mouth 3 (three) times daily.     hydrochlorothiazide (HYDRODIURIL) 25 MG tablet 1 tablet in the morning     Multiple Vitamin (MULTIVITAMIN) capsule Take 1 capsule by mouth daily.     oxyCODONE (OXY IR/ROXICODONE) 5 MG immediate release tablet Take 1 tablet (5 mg total) by mouth every 6 (six) hours as needed for moderate pain or severe pain. 20 tablet 0   No current facility-administered medications on file prior to visit.    Allergies:  No Known Allergies    OBJECTIVE: NA d/t visit type      ASSESSMENT/PLAN: Joel Moore is a 72 y.o. year old male    OSA on BiPAP : Compliance report shows satisfactory usage, slightly elevated residual AHI at 6.5 (although previously 8.3). Continue current settings.  Discussed continued nightly usage with ensuring greater than 4 hours nightly for optimal benefit and per insurance purposes.  Continue to follow with DME company for any needed supplies or CPAP related concerns     Follow up in 1 year via MyChart VV or call earlier if needed   CC:  PCP: Marguerita Merles    I spent 15 minutes of face-to-face and non-face-to-face time with patient via MyChart video visit.  This included previsit chart review, lab review, study review, order entry, electronic health record documentation, patient education and  discussion regarding above diagnoses and treatment plan and answered all other questions to patient's satisfaction  Ihor Austin, [facility]  [facility] [address] [place], [place] [address]  Phone [phone] Fax [phone] Note: This document was prepared with digital dictation and possible smart phrase technology. Any transcriptional errors that result from this process are unintentional.

## 2023-11-13 ENCOUNTER — Encounter: Payer: Self-pay | Admitting: Adult Health

## 2023-11-13 ENCOUNTER — Telehealth: Payer: Medicare Other | Admitting: Adult Health

## 2023-11-13 ENCOUNTER — Encounter: Payer: Self-pay | Admitting: Anesthesiology

## 2023-11-13 DIAGNOSIS — G4733 Obstructive sleep apnea (adult) (pediatric): Secondary | ICD-10-CM | POA: Diagnosis not present

## 2024-03-06 ENCOUNTER — Telehealth: Payer: Self-pay | Admitting: Adult Health

## 2024-03-06 NOTE — Telephone Encounter (Signed)
 [employer] [facility] has everything they would need to send to [facility]. I forwarded this information to [employer] to take care of the patient.

## 2024-03-06 NOTE — Telephone Encounter (Signed)
 [facility]) calling to place an order for the patient. Prescription with hand signature of physician and date,face to face notes included date, copy of sleep study for CPAP supplies.  Fax to: [phone]

## 2024-05-03 ENCOUNTER — Telehealth: Payer: Self-pay | Admitting: Neurology

## 2024-05-03 NOTE — Telephone Encounter (Signed)
 Samuel Crock from [facility] has called to see if the fax was received re: supplies for pt

## 2024-05-03 NOTE — Telephone Encounter (Signed)
 Fax was not received - sent message to [employer].

## 2024-11-21 ENCOUNTER — Telehealth: Payer: Medicare Other | Admitting: Adult Health

## 2024-11-21 ENCOUNTER — Encounter: Payer: Self-pay | Admitting: Adult Health

## 2024-11-21 DIAGNOSIS — G4733 Obstructive sleep apnea (adult) (pediatric): Secondary | ICD-10-CM | POA: Diagnosis not present

## 2024-11-21 NOTE — Progress Notes (Signed)
 [facility] [address] [place]. [place] [address] [phone]       OFFICE FOLLOW UP NOTE  Joel Moore Date of Birth:  1951/10/21 Medical Record Number:  [id number]    Primary neurologist: Dr. Buck Reason for visit: BiPAP follow-up  Virtual Visit via Video Note  I connected with Joel Moore on 11/21/24 at  3:30 PM EST by a video enabled telemedicine application and verified that I am speaking with the correct person using two identifiers.  Location: Patient: at home Provider: in office   I discussed the limitations of evaluation and management by telemedicine and the availability of in person appointments. The patient expressed understanding and agreed to proceed.    SUBJECTIVE:   Follow-up visit:  Prior visit: 11/13/2023  Brief HPI:   Joel Moore is a 73 y.o. male who was initially evaluated by Dr. Buck on 04/14/2022 for concern of underlying sleep apnea with reports of snoring, witnessed apneas and excessive daytime somnolence.  ESS 13/24. FSS 18/63.  Sleep study 05/10/2022 showed moderate to severe sleep apnea with total AHI 26.1/h, supine AHI 61.9/h with primarily obstructive component but also central component with evidence of periodic breathing, and possible Cheyne-Stokes.  Proceeded with titration study on 7/3, study demonstrated primary central sleep apnea and no resolution of apnea with standard CPAP therefore prescribed auto BiPAP.  Set up date 09/01/2022.     Interval history:  Returns for yearly follow-up via MyChart video visit.  Continues to do well on BiPAP therapy.  Continues to tolerate without difficulty.  Unfortunately, his wife passed away back in 04-21-2024 so sleep understandably has been altered since that time but gradually getting better.  He does have a good support system and family lives nearby.  Notes continued benefit with BiPAP use. Up to date on supplies, followed by DME [employer] [facility] and receives supplies through  [facility].  No questions or concerns at this time.        ROS:   14 system review of systems performed and negative with exception of those listed in HPI  PMH:  Past Medical History:  Diagnosis Date   GERD (gastroesophageal reflux disease)    High cholesterol    Meniere disease    OA (osteoarthritis)    fingers and L knee, L5-S1 DDD   Pre-diabetes    Umbilical hernia     PSH:  Past Surgical History:  Procedure Laterality Date   CATARACT EXTRACTION     Dr. Lavonia   INSERTION OF MESH N/A 06/16/2020   Procedure: INSERTION OF MESH;  Surgeon: Vernetta Berg, MD;  Location: [facility];  Service: General;  Laterality: N/A;   KNEE ARTHROSCOPY  1980's   KNEE SURGERY Left    UMBILICAL HERNIA REPAIR N/A 06/16/2020   Procedure: UMBILICAL HERNIA REPAIR WITH MESH;  Surgeon: Vernetta Berg, MD;  Location: [facility];  Service: General;  Laterality: N/A;  LMA    Social History:  Social History   Socioeconomic History   Marital status: Married    Spouse name: Not on file   Number of children: Not on file   Years of education: Not on file   Highest education level: Not on file  Occupational History   Not on file  Tobacco Use   Smoking status: Never   Smokeless tobacco: Never  Vaping Use   Vaping status: Never Used  Substance and Sexual Activity   Alcohol use: Yes    Alcohol/week: 4.0 standard  drinks of alcohol    Types: 4 Cans of beer per week   Drug use: Never   Sexual activity: Not on file  Other Topics Concern   Not on file  Social History Narrative   Right handed   One story home   Occasionally caffeine   Social Drivers of Health   Financial Resource Strain: Not on file  Food Insecurity: Not on file  Transportation Needs: Not on file  Physical Activity: Not on file  Stress: Not on file  Social Connections: Not on file  Intimate Partner Violence: Not on file    Family History:  Family History  Problem Relation Age of  Onset   Diabetes Mother    Dementia Father    Cancer Father    Sleep apnea Brother     Medications:   Current Outpatient Medications on File Prior to Visit  Medication Sig Dispense Refill   Albuterol Sulfate (PROAIR RESPICLICK) 108 (90 Base) MCG/ACT AEPB 1 puff as needed     aspirin 81 MG EC tablet Take by mouth.     atorvastatin (LIPITOR) 20 MG tablet      Fexofenadine-Pseudoephedrine (ALLEGRA-D 12 HOUR PO)      glucosamine-chondroitin 500-400 MG tablet Take 1 tablet by mouth 3 (three) times daily.     hydrochlorothiazide (HYDRODIURIL) 25 MG tablet 1 tablet in the morning     Multiple Vitamin (MULTIVITAMIN) capsule Take 1 capsule by mouth daily.     oxyCODONE  (OXY IR/ROXICODONE ) 5 MG immediate release tablet Take 1 tablet (5 mg total) by mouth every 6 (six) hours as needed for moderate pain or severe pain. 20 tablet 0   No current facility-administered medications on file prior to visit.    Allergies:  No Known Allergies    OBJECTIVE: NA d/t visit type      ASSESSMENT/PLAN: Joel Moore is a 73 y.o. year old male    OSA on BiPAP :  Compliance report shows satisfactory usage, slightly elevated residual AHI at 6.8 Continue current settings IPAP 16 EPAP 4 with PS of 4.   Discussed continued nightly usage with ensuring greater than 4 hours nightly for optimal benefit and per insurance purposes.   Continue to follow with DME company [employer] for any needed supplies or CPAP related concerns Set up 08/2022     Follow up in 1 year via MyChart VV or call earlier if needed   CC:  PCP: Onita Oneil Harlene Whitfield, AGNP-BC  [facility] [address] [place], [place] [address]  Phone [phone] Fax [phone] Note: This document was prepared with digital dictation and possible smart phrase technology. Any transcriptional errors that result from this process are unintentional.

## 2025-11-27 ENCOUNTER — Telehealth: Admitting: Adult Health
